# Patient Record
Sex: Male | Born: 1950 | Race: White | Hispanic: No | Marital: Single | Smoking: Current some day smoker
Health system: Southern US, Community
[De-identification: ages and names within clinical notes are randomized; demographics above are authoritative.]

## PROBLEM LIST (undated history)

## (undated) DIAGNOSIS — I10 Essential (primary) hypertension: Secondary | ICD-10-CM

## (undated) DIAGNOSIS — Z923 Personal history of irradiation: Secondary | ICD-10-CM

## (undated) DIAGNOSIS — R0600 Dyspnea, unspecified: Secondary | ICD-10-CM

## (undated) DIAGNOSIS — K219 Gastro-esophageal reflux disease without esophagitis: Secondary | ICD-10-CM

## (undated) DIAGNOSIS — C801 Malignant (primary) neoplasm, unspecified: Secondary | ICD-10-CM

## (undated) DIAGNOSIS — I219 Acute myocardial infarction, unspecified: Secondary | ICD-10-CM

## (undated) DIAGNOSIS — T148XXA Other injury of unspecified body region, initial encounter: Secondary | ICD-10-CM

## (undated) DIAGNOSIS — D496 Neoplasm of unspecified behavior of brain: Secondary | ICD-10-CM

## (undated) DIAGNOSIS — C3490 Malignant neoplasm of unspecified part of unspecified bronchus or lung: Secondary | ICD-10-CM

## (undated) DIAGNOSIS — C189 Malignant neoplasm of colon, unspecified: Secondary | ICD-10-CM

## (undated) HISTORY — PX: PORTA CATH INSERTION: CATH118285

## (undated) HISTORY — PX: OTHER SURGICAL HISTORY: SHX169

## (undated) HISTORY — PX: APPENDECTOMY: SHX54

---

## 2002-03-18 HISTORY — PX: FACIAL RECONSTRUCTION SURGERY: SHX631

## 2011-03-19 DIAGNOSIS — C189 Malignant neoplasm of colon, unspecified: Secondary | ICD-10-CM

## 2011-03-19 HISTORY — DX: Malignant neoplasm of colon, unspecified: C18.9

## 2014-06-29 DIAGNOSIS — D124 Benign neoplasm of descending colon: Secondary | ICD-10-CM | POA: Diagnosis not present

## 2015-03-19 DIAGNOSIS — C3490 Malignant neoplasm of unspecified part of unspecified bronchus or lung: Secondary | ICD-10-CM

## 2015-03-19 HISTORY — DX: Malignant neoplasm of unspecified part of unspecified bronchus or lung: C34.90

## 2015-11-10 DIAGNOSIS — T148 Other injury of unspecified body region: Secondary | ICD-10-CM | POA: Diagnosis not present

## 2015-11-10 DIAGNOSIS — I10 Essential (primary) hypertension: Secondary | ICD-10-CM | POA: Diagnosis not present

## 2015-11-10 DIAGNOSIS — S60949A Unspecified superficial injury of unspecified finger, initial encounter: Secondary | ICD-10-CM | POA: Diagnosis not present

## 2017-05-16 DIAGNOSIS — F17218 Nicotine dependence, cigarettes, with other nicotine-induced disorders: Secondary | ICD-10-CM

## 2017-05-16 DIAGNOSIS — Z8579 Personal history of other malignant neoplasms of lymphoid, hematopoietic and related tissues: Secondary | ICD-10-CM | POA: Diagnosis not present

## 2017-05-16 DIAGNOSIS — Z85038 Personal history of other malignant neoplasm of large intestine: Secondary | ICD-10-CM

## 2017-05-16 DIAGNOSIS — C7A1 Malignant poorly differentiated neuroendocrine tumors: Secondary | ICD-10-CM | POA: Diagnosis not present

## 2017-06-05 DIAGNOSIS — C7A1 Malignant poorly differentiated neuroendocrine tumors: Secondary | ICD-10-CM

## 2017-06-27 ENCOUNTER — Encounter: Payer: Self-pay | Admitting: Gastroenterology

## 2017-07-01 DIAGNOSIS — C7A1 Malignant poorly differentiated neuroendocrine tumors: Secondary | ICD-10-CM | POA: Diagnosis not present

## 2017-07-15 DIAGNOSIS — C7A1 Malignant poorly differentiated neuroendocrine tumors: Secondary | ICD-10-CM | POA: Diagnosis not present

## 2017-07-15 DIAGNOSIS — K208 Other esophagitis: Secondary | ICD-10-CM | POA: Diagnosis not present

## 2017-07-29 DIAGNOSIS — C7A1 Malignant poorly differentiated neuroendocrine tumors: Secondary | ICD-10-CM | POA: Diagnosis not present

## 2017-09-05 DIAGNOSIS — R590 Localized enlarged lymph nodes: Secondary | ICD-10-CM

## 2017-09-05 DIAGNOSIS — C3431 Malignant neoplasm of lower lobe, right bronchus or lung: Secondary | ICD-10-CM | POA: Diagnosis not present

## 2017-09-05 DIAGNOSIS — Z9221 Personal history of antineoplastic chemotherapy: Secondary | ICD-10-CM

## 2017-09-05 DIAGNOSIS — Z923 Personal history of irradiation: Secondary | ICD-10-CM

## 2017-10-08 DIAGNOSIS — C3431 Malignant neoplasm of lower lobe, right bronchus or lung: Secondary | ICD-10-CM

## 2017-11-05 DIAGNOSIS — C3431 Malignant neoplasm of lower lobe, right bronchus or lung: Secondary | ICD-10-CM

## 2017-12-31 DIAGNOSIS — C3431 Malignant neoplasm of lower lobe, right bronchus or lung: Secondary | ICD-10-CM

## 2018-01-28 DIAGNOSIS — I951 Orthostatic hypotension: Secondary | ICD-10-CM | POA: Diagnosis not present

## 2018-01-28 DIAGNOSIS — C3431 Malignant neoplasm of lower lobe, right bronchus or lung: Secondary | ICD-10-CM | POA: Diagnosis not present

## 2018-02-10 DIAGNOSIS — C7931 Secondary malignant neoplasm of brain: Secondary | ICD-10-CM

## 2018-02-10 DIAGNOSIS — C3431 Malignant neoplasm of lower lobe, right bronchus or lung: Secondary | ICD-10-CM

## 2018-02-10 DIAGNOSIS — Z7952 Long term (current) use of systemic steroids: Secondary | ICD-10-CM | POA: Diagnosis not present

## 2018-02-11 ENCOUNTER — Other Ambulatory Visit: Payer: Self-pay | Admitting: Radiation Therapy

## 2018-02-11 DIAGNOSIS — C7931 Secondary malignant neoplasm of brain: Secondary | ICD-10-CM | POA: Diagnosis not present

## 2018-02-11 DIAGNOSIS — C3431 Malignant neoplasm of lower lobe, right bronchus or lung: Secondary | ICD-10-CM | POA: Diagnosis not present

## 2018-02-11 NOTE — Progress Notes (Signed)
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   Past or anticipated interventions, if any, per neurosurgery:  He denies seeing anybody from neurosurgery.   Past or anticipated interventions, if any, per medical oncology:  He is being treated Dr. Lewis in [place].   Dose of Decadron, if applicable: He tells me he is taking a small green pill. ? Decadron. He is not sure of the dose.   Recent neurologic symptoms, if any:   Seizures: No  Headaches: He reported headaches before brain scan. He has not had headaches since receiving a shot. ? Decadron.   Nausea: He had nausea at the time of his brain scan, but it has improved.   Dizziness/ataxia: Yes.   Difficulty with hand coordination: No  Focal numbness/weakness: No  Visual deficits/changes: No  Confusion/Memory deficits: No  Painful bone metastases at present, if any:  He denies.   SAFETY ISSUES:  Prior radiation? Yes, tonsil radiation, in [place] ? 2016, Lung radiation ?2018 in [place].   Pacemaker/ICD? No  Possible current pregnancy? No  Is the patient on methotrexate? No  Additional Complaints / other details:  No chart at this time. - notes to looked into by Clerical.   BP (!) 128/93 (BP Location: Left Arm)   Pulse 60   Temp 98 F (36.7 C) (Oral)   Ht 5' 11" (1.803 m)   Wt 155 lb (70.3 kg)   SpO2 99%   BMI 21.62 kg/m    Wt Readings from Last 3 Encounters:  02/16/18 155 lb (70.3 kg)   

## 2018-02-16 ENCOUNTER — Other Ambulatory Visit: Payer: Self-pay | Admitting: Neurosurgery

## 2018-02-16 ENCOUNTER — Ambulatory Visit
Admission: RE | Admit: 2018-02-16 | Discharge: 2018-02-16 | Disposition: A | Discharge status: Home / Self Care | Payer: Medicare Other | Source: Ambulatory Visit | Attending: Radiation Oncology | Admitting: Radiation Oncology

## 2018-02-16 ENCOUNTER — Other Ambulatory Visit: Payer: Self-pay | Admitting: Radiation Therapy

## 2018-02-16 ENCOUNTER — Encounter: Payer: Self-pay | Admitting: Radiation Oncology

## 2018-02-16 ENCOUNTER — Other Ambulatory Visit: Payer: Self-pay

## 2018-02-16 VITALS — BP 128/93 | HR 60 | Temp 98.0°F | Ht 71.0 in | Wt 155.0 lb

## 2018-02-16 DIAGNOSIS — C7931 Secondary malignant neoplasm of brain: Secondary | ICD-10-CM

## 2018-02-16 DIAGNOSIS — Z7982 Long term (current) use of aspirin: Secondary | ICD-10-CM | POA: Insufficient documentation

## 2018-02-16 DIAGNOSIS — Z79899 Other long term (current) drug therapy: Secondary | ICD-10-CM | POA: Insufficient documentation

## 2018-02-16 DIAGNOSIS — F17210 Nicotine dependence, cigarettes, uncomplicated: Secondary | ICD-10-CM | POA: Diagnosis not present

## 2018-02-16 DIAGNOSIS — I10 Essential (primary) hypertension: Secondary | ICD-10-CM | POA: Insufficient documentation

## 2018-02-16 DIAGNOSIS — C7949 Secondary malignant neoplasm of other parts of nervous system: Principal | ICD-10-CM

## 2018-02-16 HISTORY — DX: Acute myocardial infarction, unspecified: I21.9

## 2018-02-16 HISTORY — DX: Malignant (primary) neoplasm, unspecified: C80.1

## 2018-02-16 HISTORY — DX: Gastro-esophageal reflux disease without esophagitis: K21.9

## 2018-02-16 HISTORY — DX: Essential (primary) hypertension: I10

## 2018-02-16 HISTORY — DX: Malignant neoplasm of unspecified part of unspecified bronchus or lung: C34.90

## 2018-02-16 HISTORY — DX: Malignant neoplasm of colon, unspecified: C18.9

## 2018-02-16 NOTE — Progress Notes (Addendum)
Has armband been applied?  Yes  Does patient have an allergy to IV contrast dye?: No   Has patient ever received premedication for IV contrast dye?: N/A  Does patient take metformin?: No  If patient does take metformin when was the last dose: N/A  Date of lab work: 02/04/18 at [facility] BUN: 16 CR: 0.50 EGFR: >60  IV site: R PAC accessed without difficulty. Blood return noted.   Alexander Monroe is scheduled for a 3 T MRI today at 2 pm at [facility]. He has requested to stay accessed until the MRI today to avoid a second stick today. I have called and spoken to [facility] and made them aware that he will come already accessed and they will need to deaccess him. The person I spoke to voiced her understanding. I also informed [name] that he will remain accessed until his MRI. He voiced his appreciation and understands to make sure they remove the PAC needle after his MRI scan.   Has IV site been added to flowsheet?  Yes  BP 122/89   Pulse (!) 55   Temp 97.6 F (36.4 C) (Oral)   Wt 155 lb 6.4 oz (70.5 kg)   SpO2 100%   BMI 21.67 kg/m

## 2018-02-16 NOTE — Progress Notes (Signed)
error 

## 2018-02-17 ENCOUNTER — Ambulatory Visit (HOSPITAL_COMMUNITY): Payer: Medicare Other

## 2018-02-17 ENCOUNTER — Ambulatory Visit
Admission: RE | Admit: 2018-02-17 | Discharge: 2018-02-17 | Disposition: A | Discharge status: Home / Self Care | Payer: No Typology Code available for payment source | Source: Ambulatory Visit | Attending: Radiation Oncology | Admitting: Radiation Oncology

## 2018-02-17 ENCOUNTER — Ambulatory Visit
Admission: RE | Admit: 2018-02-17 | Payer: No Typology Code available for payment source | Source: Ambulatory Visit | Admitting: Radiation Oncology

## 2018-02-17 DIAGNOSIS — C7931 Secondary malignant neoplasm of brain: Secondary | ICD-10-CM | POA: Insufficient documentation

## 2018-02-17 DIAGNOSIS — Z510 Encounter for antineoplastic radiation therapy: Secondary | ICD-10-CM | POA: Insufficient documentation

## 2018-02-17 DIAGNOSIS — C3432 Malignant neoplasm of lower lobe, left bronchus or lung: Secondary | ICD-10-CM | POA: Insufficient documentation
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
## 2018-02-18 ENCOUNTER — Ambulatory Visit
Admission: RE | Admit: 2018-02-18 | Discharge: 2018-02-18 | Disposition: A | Discharge status: Home / Self Care | Payer: Self-pay | Source: Ambulatory Visit | Attending: Radiation Oncology | Admitting: Radiation Oncology

## 2018-02-18 ENCOUNTER — Other Ambulatory Visit: Payer: Self-pay | Admitting: Radiation Oncology

## 2018-02-18 ENCOUNTER — Encounter: Payer: Self-pay | Admitting: Radiation Oncology

## 2018-02-18 ENCOUNTER — Ambulatory Visit
Admission: RE | Admit: 2018-02-18 | Discharge: 2018-02-18 | Disposition: A | Discharge status: Home / Self Care | Payer: No Typology Code available for payment source | Source: Ambulatory Visit | Attending: Radiation Oncology | Admitting: Radiation Oncology

## 2018-02-18 ENCOUNTER — Ambulatory Visit (HOSPITAL_COMMUNITY)
Admission: RE | Admit: 2018-02-18 | Discharge: 2018-02-18 | Disposition: A | Discharge status: Home / Self Care | Payer: No Typology Code available for payment source | Source: Ambulatory Visit | Attending: Radiation Oncology | Admitting: Radiation Oncology

## 2018-02-18 VITALS — BP 122/89 | HR 55 | Temp 97.6°F | Wt 155.4 lb

## 2018-02-18 DIAGNOSIS — Z510 Encounter for antineoplastic radiation therapy: Secondary | ICD-10-CM | POA: Diagnosis not present

## 2018-02-18 DIAGNOSIS — C7949 Secondary malignant neoplasm of other parts of nervous system: Secondary | ICD-10-CM | POA: Insufficient documentation

## 2018-02-18 DIAGNOSIS — C3432 Malignant neoplasm of lower lobe, left bronchus or lung: Secondary | ICD-10-CM | POA: Diagnosis not present

## 2018-02-18 DIAGNOSIS — C7931 Secondary malignant neoplasm of brain: Secondary | ICD-10-CM

## 2018-02-18 LAB — CREATININE, SERUM
Creatinine, Ser: 0.64 mg/dL (ref 0.61–1.24)
GFR calc Af Amer: >60 mL/min (ref >60)
GFR calc non Af Amer: >60 mL/min (ref >60)

## 2018-02-18 MED ORDER — HEPARIN SOD (PORK) LOCK FLUSH 100 UNIT/ML IV SOLN
500.0000 [IU] | INTRAVENOUS | Status: AC | PRN
  Administered 2018-02-18: 500 [IU]

## 2018-02-18 MED ORDER — GADOBUTROL 1 MMOL/ML IV SOLN
7.0000 mL | Freq: Once | INTRAVENOUS | Status: AC | PRN
  Administered 2018-02-18: 7 mL via INTRAVENOUS

## 2018-02-18 MED ORDER — SODIUM CHLORIDE 0.9% FLUSH
10.0000 mL | Freq: Once | INTRAVENOUS | Status: AC
  Administered 2018-02-18: 10 mL via INTRAVENOUS

## 2018-02-18 NOTE — Progress Notes (Signed)
error 

## 2018-02-18 NOTE — H&P (View-Only) (Signed)
  Radiation Oncology         [phone] ________________________________  Name: Alexander Monroe MRN: [id number]  Date: 02/18/2018  DOB: Dec 05, 1950  Outpatient  SIMULATION AND TREATMENT PLANNING NOTE; SPECIAL TREATMENT PROCEDURE NOTE:   DIAGNOSIS:    ICD-10-CM   1. Brain metastasis ([place]) C79.31     NARRATIVE:  The patient was brought to the [place].  Identity was confirmed.  All relevant records and images related to the planned course of therapy were reviewed.  The patient freely provided informed written consent to proceed with treatment after reviewing the details related to the planned course of therapy. The consent form was witnessed and verified by the simulation staff. Intravenous access was established for contrast administration. Then, the patient was set-up in a stable reproducible supine position for radiation therapy.  A relocatable thermoplastic stereotactic head frame was fabricated for precise immobilization.  CT images were obtained.  Surface markings were placed.  The CT images were loaded into the planning software and fused with the patient's targeting MRI scan.  Then the target and avoidance structures were contoured.  Treatment planning then occurred.  The radiation prescription was entered and confirmed.  I have requested 3D planning  I have requested a DVH of the following structures: Brain stem, brain, left eye, right ete, lenses, optic chiasm, target volumes, uninvolved brain, and normal tissue.    PLAN:  The patient will receive 14 Gy in 1 fraction to cerebellar metastasis preoperatively. Stereotactic radiosurgery will be used.  SPECIAL TREATMENT PROCEDURE NOTE:   This constitutes a special treatment procedure due to the ablative dose that will be delivered and the technical nature of treatment.  This highly technical modality of treatment ensures that the ablative dose is centered on the patient's tumor while sparing normal tissues from excessive  dose and risk of detrimental effects.   -----------------------------------  Eppie Gibson, MD

## 2018-02-18 NOTE — Progress Notes (Addendum)
  Radiation Oncology         [phone]) [phone] ________________________________  Name: Alexander Monroe MRN: [id number]  Date: 02/18/2018  DOB: 04-18-1950  Outpatient  SIMULATION AND TREATMENT PLANNING NOTE; SPECIAL TREATMENT PROCEDURE NOTE:   DIAGNOSIS:    ICD-10-CM   1. Brain metastasis ([place]) C79.31     NARRATIVE:  The patient was brought to the [place].  Identity was confirmed.  All relevant records and images related to the planned course of therapy were reviewed.  The patient freely provided informed written consent to proceed with treatment after reviewing the details related to the planned course of therapy. The consent form was witnessed and verified by the simulation staff. Intravenous access was established for contrast administration. Then, the patient was set-up in a stable reproducible supine position for radiation therapy.  A relocatable thermoplastic stereotactic head frame was fabricated for precise immobilization.  CT images were obtained.  Surface markings were placed.  The CT images were loaded into the planning software and fused with the patient's targeting MRI scan.  Then the target and avoidance structures were contoured.  Treatment planning then occurred.  The radiation prescription was entered and confirmed.  I have requested 3D planning  I have requested a DVH of the following structures: Brain stem, brain, left eye, right ete, lenses, optic chiasm, target volumes, uninvolved brain, and normal tissue.    PLAN:  The patient will receive 14 Gy in 1 fraction to cerebellar metastasis preoperatively. Stereotactic radiosurgery will be used.  SPECIAL TREATMENT PROCEDURE NOTE:   This constitutes a special treatment procedure due to the ablative dose that will be delivered and the technical nature of treatment.  This highly technical modality of treatment ensures that the ablative dose is centered on the patient's tumor while sparing normal tissues from excessive  dose and risk of detrimental effects.   -----------------------------------  Eppie Gibson, MD

## 2018-02-19 NOTE — Pre-Procedure Instructions (Signed)
TAYE CATO  02/19/2018      [place], [place] [address] Phone: [phone] Fax: [phone]    Your procedure is scheduled on Dec. 10  Report to [facility] Admitting at 5:30  A.M.  Call this number if you have problems the morning of surgery:  [phone]   Remember:   Do not eat or drink after midnight.      Take these medicines the morning of surgery with A SIP OF WATER :              Carvedilol (coreg)             Dexamethasone (decadron)             Omeprazole (prilosec)             Ondansetron (zofran) if needed    Do not wear jewelry.  Do not wear lotions, powders, or perfumes, or deodorant.  Do not shave 48 hours prior to surgery.  Men may shave face and neck.  Do not bring valuables to the hospital.  [facility] is not responsible for any belongings or valuables.  Contacts, dentures or bridgework may not be worn into surgery.  Leave your suitcase in the car.  After surgery it may be brought to your room.  For patients admitted to the hospital, discharge time will be determined by your treatment team.  Patients discharged the day of surgery will not be allowed to drive home.    Special instructions:  [place]- Preparing For Surgery  Before surgery, you can play an important role. Because skin is not sterile, your skin needs to be as free of germs as possible. You can reduce the number of germs on your skin by washing with CHG (chlorahexidine gluconate) Soap before surgery.  CHG is an antiseptic cleaner which kills germs and bonds with the skin to continue killing germs even after washing.    Oral Hygiene is also important to reduce your risk of infection.  Remember - BRUSH YOUR TEETH THE MORNING OF SURGERY WITH YOUR REGULAR TOOTHPASTE  Please do not use if you have an allergy to CHG or antibacterial soaps. If your skin becomes reddened/irritated stop using  the CHG.  Do not shave (including legs and underarms) for at least 48 hours prior to first CHG shower. It is OK to shave your face.  Please follow these instructions carefully.   1. Shower the NIGHT BEFORE SURGERY and the MORNING OF SURGERY with CHG.   2. If you chose to wash your hair, wash your hair first as usual with your normal shampoo.  3. After you shampoo, rinse your hair and body thoroughly to remove the shampoo.  4. Use CHG as you would any other liquid soap. You can apply CHG directly to the skin and wash gently with a scrungie or a clean washcloth.   5. Apply the CHG Soap to your body ONLY FROM THE NECK DOWN.  Do not use on open wounds or open sores. Avoid contact with your eyes, ears, mouth and genitals (private parts). Wash Face and genitals (private parts)  with your normal soap.  6. Wash thoroughly, paying special attention to the area where your surgery will be performed.  7. Thoroughly rinse your body with warm water from the neck down.  8. DO NOT shower/wash with your normal soap after using and rinsing  off the CHG Soap.  9. Pat yourself dry with a CLEAN TOWEL.  10. Wear CLEAN PAJAMAS to bed the night before surgery, wear comfortable clothes the morning of surgery  11. Place CLEAN SHEETS on your bed the night of your first shower and DO NOT SLEEP WITH PETS.    Day of Surgery:  Do not apply any deodorants/lotions.  Please wear clean clothes to the hospital/surgery center.   Remember to brush your teeth WITH YOUR REGULAR TOOTHPASTE.    Please read over the following fact sheets that you were given. Coughing and Deep Breathing and Surgical Site Infection Prevention

## 2018-02-20 ENCOUNTER — Encounter (HOSPITAL_COMMUNITY): Payer: Self-pay

## 2018-02-20 ENCOUNTER — Encounter (HOSPITAL_COMMUNITY)
Admission: RE | Admit: 2018-02-20 | Discharge: 2018-02-20 | Disposition: A | Discharge status: Home / Self Care | Payer: Medicare Other | Source: Ambulatory Visit | Attending: Neurosurgery | Admitting: Neurosurgery

## 2018-02-20 DIAGNOSIS — Z01818 Encounter for other preprocedural examination: Secondary | ICD-10-CM | POA: Insufficient documentation

## 2018-02-20 DIAGNOSIS — Z510 Encounter for antineoplastic radiation therapy: Secondary | ICD-10-CM | POA: Diagnosis not present

## 2018-02-20 DIAGNOSIS — R001 Bradycardia, unspecified: Secondary | ICD-10-CM | POA: Insufficient documentation

## 2018-02-20 HISTORY — DX: Other injury of unspecified body region, initial encounter: T14.8XXA

## 2018-02-20 HISTORY — DX: Dyspnea, unspecified: R06.00

## 2018-02-20 LAB — BASIC METABOLIC PANEL
Anion gap: 10 (ref 5–15)
BUN: 17 mg/dL (ref 8–23)
CALCIUM: 9.1 mg/dL (ref 8.9–10.3)
CO2: 23 mmol/L (ref 22–32)
Chloride: 106 mmol/L (ref 98–111)
Creatinine, Ser: 0.72 mg/dL (ref 0.61–1.24)
GFR calc Af Amer: >60 mL/min (ref >60)
GFR calc non Af Amer: >60 mL/min (ref >60)
Glucose, Bld: 110 mg/dL — ABNORMAL HIGH (ref 70–99)
Potassium: 4.1 mmol/L (ref 3.5–5.1)
Sodium: 139 mmol/L (ref 135–145)

## 2018-02-20 LAB — TYPE AND SCREEN
ABO/RH(D): A POS
Antibody Screen: NEGATIVE

## 2018-02-20 LAB — CBC
HCT: 44.7 % (ref 39.0–52.0)
Hemoglobin: 13.5 g/dL (ref 13.0–17.0)
MCH: 27.2 pg (ref 26.0–34.0)
MCHC: 30.2 g/dL (ref 30.0–36.0)
MCV: 90.1 fL (ref 80.0–100.0)
PLATELETS: 258 10*3/uL (ref 150–400)
RBC: 4.96 MIL/uL (ref 4.22–5.81)
RDW: 15.2 % (ref 11.5–15.5)
WBC: 11.6 10*3/uL — ABNORMAL HIGH (ref 4.0–10.5)
nRBC: 0 % (ref 0.0–0.2)

## 2018-02-20 LAB — ABO/RH: ABO/RH(D): A POS

## 2018-02-20 NOTE — Progress Notes (Signed)
PCP: Sharmaine Base @ [facility]  No cardiologist [facility] for chemo tx.  Pt. Not instructed when to stop aspirin,pt. To call Dr. Melven Sartorius office for clairification  Pt. Stated he fell 02/12/18, went to ED in [place],stated he tore some ligaments in right shoulder and they gave him a sling to wear. Stated he notified Dr. Vertell Limber of this fall.

## 2018-02-23 ENCOUNTER — Ambulatory Visit
Admission: RE | Admit: 2018-02-23 | Discharge: 2018-02-23 | Disposition: A | Discharge status: Home / Self Care | Payer: No Typology Code available for payment source | Source: Ambulatory Visit | Attending: Radiation Oncology | Admitting: Radiation Oncology

## 2018-02-23 DIAGNOSIS — Z510 Encounter for antineoplastic radiation therapy: Secondary | ICD-10-CM | POA: Diagnosis not present

## 2018-02-23 DIAGNOSIS — Z923 Personal history of irradiation: Secondary | ICD-10-CM

## 2018-02-23 HISTORY — DX: Personal history of irradiation: Z92.3

## 2018-02-23 NOTE — Op Note (Signed)
  Name: Alexander Monroe  MRN: [id number]  Date: 02/23/2018   DOB: Aug 13, 1950  Stereotactic Radiosurgery Operative Note  PRE-OPERATIVE DIAGNOSIS:  Multiple Brain Metastases  POST-OPERATIVE DIAGNOSIS:  Multiple Brain Metastases  PROCEDURE:  Stereotactic Radiosurgery  SURGEON:  Peggyann Shoals, MD  NARRATIVE: The patient underwent a radiation treatment planning session in the radiation oncology simulation suite under the care of the radiation oncology physician and physicist.  I participated closely in the radiation treatment planning afterwards. The patient underwent planning CT which was fused to 3T high resolution MRI with 1 mm axial slices.  These images were fused on the planning system.  We contoured the gross target volumes and subsequently expanded this to yield the Planning Target Volume. I actively participated in the planning process.  I helped to define and review the target contours and also the contours of the optic pathway, eyes, brainstem and selected nearby organs at risk.  All the dose constraints for critical structures were reviewed and compared to AAPM Task Group 101.  The prescription dose conformity was reviewed.  I approved the plan electronically.    Accordingly, Alexander Monroe was brought to the TrueBeam stereotactic radiation treatment linac and placed in the custom immobilization mask.  The patient was aligned according to the IR fiducial markers with BrainLab Exactrac, then orthogonal x-rays were used in ExacTrac with the 6DOF robotic table and the shifts were made to align the patient  Alexander Monroe received stereotactic radiosurgery uneventfully.    Lesions treated:  1   Complex lesions treated:  0 (>3.5 cm, <2mm of optic path, or within the brainstem)   The detailed description of the procedure is recorded in the radiation oncology procedure note.  I was present for the duration of the procedure.  DISPOSITION:  Following delivery, the patient was transported to  nursing in stable condition and monitored for possible acute effects to be discharged to home in stable condition with follow-up in one month.  Peggyann Shoals, MD 02/23/2018 8:32 AM

## 2018-02-23 NOTE — Anesthesia Preprocedure Evaluation (Addendum)
Anesthesia Evaluation  Patient identified by MRN, date of birth, ID band Patient awake    Reviewed: Allergy & Precautions, NPO status , Patient's Chart, lab work & pertinent test results  History of Anesthesia Complications Negative for: history of anesthetic complications  Airway Mallampati: III  TM Distance: >3 FB Neck ROM: Full    Dental  (+) Edentulous Upper, Edentulous Lower   Pulmonary shortness of breath, Current Smoker,    breath sounds clear to auscultation       Cardiovascular hypertension, Pt. on medications + Past MI   Rhythm:Regular     Neuro/Psych Brain tumor with numbness and weakness in legs per patient    GI/Hepatic Neg liver ROS, GERD  ,  Endo/Other  negative endocrine ROS  Renal/GU negative Renal ROS     Musculoskeletal   Abdominal   Peds  Hematology negative hematology ROS (+)   Anesthesia Other Findings   Reproductive/Obstetrics                            Anesthesia Physical Anesthesia Plan  ASA: III  Anesthesia Plan: General   Post-op Pain Management:    Induction: Intravenous  PONV Risk Score and Plan: 1 and Ondansetron and Dexamethasone  Airway Management Planned: Oral ETT  Additional Equipment: Arterial line  Intra-op Plan:   Post-operative Plan: Extubation in OR and Possible Post-op intubation/ventilation  Informed Consent: I have reviewed the patients History and Physical, chart, labs and discussed the procedure including the risks, benefits and alternatives for the proposed anesthesia with the patient or authorized representative who has indicated his/her understanding and acceptance.   Dental advisory given  Plan Discussed with: CRNA and Surgeon  Anesthesia Plan Comments: (Recent EBUS at [facility] 04/08/2017. Review of anesthesia record in care everywhere shows difficult airway. Anesthesia record as follows:  Endotracheal tube insertion site:  oral Blade: Miller Blade size: #2 ETT size: 8.0 mm View (Cormack Lehane grade): grade IIb - view of arytenoids or posterior of glottis only Placement verified by: chest auscultation/breath sounds equal bilaterally and capnometry/+EtCO2  Measured from: gums  Number of attempts at approach: 3 or more Ventilation between attempts: 2 hand mask Additional Comments DVL by Cruciani with mac 3 and miller 2, unable to visualize due to large tounge, DVL by Dr Lajuan Lines, DVL x 1 and successful )       Anesthesia Quick Evaluation

## 2018-02-23 NOTE — Addendum Note (Signed)
Encounter addended by: Eppie Gibson, MD on: 02/23/2018 8:03 AM  Actions taken: Sign clinical note

## 2018-02-24 ENCOUNTER — Encounter (HOSPITAL_COMMUNITY)
Admission: RE | Disposition: A | Discharge status: Home / Self Care | Payer: Self-pay | Source: Home / Self Care | Attending: Neurosurgery

## 2018-02-24 ENCOUNTER — Inpatient Hospital Stay (HOSPITAL_COMMUNITY)
Admission: RE | Admit: 2018-02-24 | Discharge: 2018-02-25 | DRG: 026 | Disposition: A | Discharge status: Home / Self Care | Payer: Medicare Other | Attending: Neurosurgery | Admitting: Neurosurgery

## 2018-02-24 ENCOUNTER — Encounter (HOSPITAL_COMMUNITY): Payer: Self-pay | Admitting: Certified Registered"

## 2018-02-24 ENCOUNTER — Inpatient Hospital Stay (HOSPITAL_COMMUNITY): Payer: Medicare Other

## 2018-02-24 ENCOUNTER — Inpatient Hospital Stay (HOSPITAL_COMMUNITY): Payer: Medicare Other | Admitting: Physician Assistant

## 2018-02-24 ENCOUNTER — Inpatient Hospital Stay (HOSPITAL_COMMUNITY): Payer: Medicare Other | Admitting: Certified Registered"

## 2018-02-24 ENCOUNTER — Other Ambulatory Visit: Payer: Self-pay

## 2018-02-24 DIAGNOSIS — C771 Secondary and unspecified malignant neoplasm of intrathoracic lymph nodes: Secondary | ICD-10-CM | POA: Diagnosis present

## 2018-02-24 DIAGNOSIS — Z993 Dependence on wheelchair: Secondary | ICD-10-CM

## 2018-02-24 DIAGNOSIS — K219 Gastro-esophageal reflux disease without esophagitis: Secondary | ICD-10-CM | POA: Diagnosis present

## 2018-02-24 DIAGNOSIS — C3431 Malignant neoplasm of lower lobe, right bronchus or lung: Secondary | ICD-10-CM | POA: Diagnosis present

## 2018-02-24 DIAGNOSIS — Z85038 Personal history of other malignant neoplasm of large intestine: Secondary | ICD-10-CM

## 2018-02-24 DIAGNOSIS — C3490 Malignant neoplasm of unspecified part of unspecified bronchus or lung: Secondary | ICD-10-CM | POA: Diagnosis present

## 2018-02-24 DIAGNOSIS — C7931 Secondary malignant neoplasm of brain: Secondary | ICD-10-CM | POA: Diagnosis present

## 2018-02-24 DIAGNOSIS — C7801 Secondary malignant neoplasm of right lung: Secondary | ICD-10-CM | POA: Diagnosis present

## 2018-02-24 DIAGNOSIS — Z79899 Other long term (current) drug therapy: Secondary | ICD-10-CM

## 2018-02-24 DIAGNOSIS — Z716 Tobacco abuse counseling: Secondary | ICD-10-CM

## 2018-02-24 DIAGNOSIS — Z7982 Long term (current) use of aspirin: Secondary | ICD-10-CM

## 2018-02-24 DIAGNOSIS — I10 Essential (primary) hypertension: Secondary | ICD-10-CM | POA: Diagnosis present

## 2018-02-24 DIAGNOSIS — F17210 Nicotine dependence, cigarettes, uncomplicated: Secondary | ICD-10-CM | POA: Diagnosis present

## 2018-02-24 DIAGNOSIS — I252 Old myocardial infarction: Secondary | ICD-10-CM | POA: Diagnosis not present

## 2018-02-24 DIAGNOSIS — Z85818 Personal history of malignant neoplasm of other sites of lip, oral cavity, and pharynx: Secondary | ICD-10-CM | POA: Diagnosis not present

## 2018-02-24 DIAGNOSIS — Z95828 Presence of other vascular implants and grafts: Secondary | ICD-10-CM

## 2018-02-24 HISTORY — PX: CRANIOTOMY: SHX93

## 2018-02-24 HISTORY — PX: APPLICATION OF CRANIAL NAVIGATION: SHX6578

## 2018-02-24 LAB — MRSA PCR SCREENING: MRSA by PCR: NEGATIVE

## 2018-02-24 SURGERY — CRANIOTOMY TUMOR EXCISION
Anesthesia: General | Site: Head

## 2018-02-24 MED ORDER — MIDAZOLAM HCL 2 MG/2ML IJ SOLN
INTRAMUSCULAR | Status: AC
  Filled 2018-02-24: qty 2

## 2018-02-24 MED ORDER — ROCURONIUM BROMIDE 50 MG/5ML IV SOSY
PREFILLED_SYRINGE | INTRAVENOUS | Status: AC
  Filled 2018-02-24: qty 5

## 2018-02-24 MED ORDER — ACETAMINOPHEN 650 MG RE SUPP: 650.0000 mg | RECTAL | Status: DC | PRN

## 2018-02-24 MED ORDER — ONDANSETRON HCL 4 MG PO TABS: 4.0000 mg | ORAL_TABLET | ORAL | Status: DC | PRN

## 2018-02-24 MED ORDER — 0.9 % SODIUM CHLORIDE (POUR BTL) OPTIME
TOPICAL | Status: DC | PRN
  Administered 2018-02-24 (×2): 1000 mL

## 2018-02-24 MED ORDER — SODIUM CHLORIDE 0.9% FLUSH
10.0000 mL | Freq: Two times a day (BID) | INTRAVENOUS | Status: DC
  Administered 2018-02-25 (×2): 10 mL

## 2018-02-24 MED ORDER — POTASSIUM CHLORIDE IN NACL 20-0.9 MEQ/L-% IV SOLN
INTRAVENOUS | Status: DC
  Administered 2018-02-24 – 2018-02-25 (×2): via INTRAVENOUS
  Filled 2018-02-24 (×2): qty 1000

## 2018-02-24 MED ORDER — FLEET ENEMA 7-19 GM/118ML RE ENEM: 1.0000 | ENEMA | Freq: Once | RECTAL | Status: DC | PRN

## 2018-02-24 MED ORDER — LABETALOL HCL 5 MG/ML IV SOLN: 10.0000 mg | INTRAVENOUS | Status: DC | PRN

## 2018-02-24 MED ORDER — ONDANSETRON HCL 4 MG/2ML IJ SOLN
INTRAMUSCULAR | Status: DC | PRN
  Administered 2018-02-24: 4 mg via INTRAVENOUS

## 2018-02-24 MED ORDER — DEXAMETHASONE SODIUM PHOSPHATE 10 MG/ML IJ SOLN
INTRAMUSCULAR | Status: AC
  Filled 2018-02-24: qty 1

## 2018-02-24 MED ORDER — ATORVASTATIN CALCIUM 40 MG PO TABS
40.0000 mg | ORAL_TABLET | Freq: Every day | ORAL | Status: DC
  Administered 2018-02-24: 40 mg via ORAL
  Filled 2018-02-24: qty 1

## 2018-02-24 MED ORDER — SUGAMMADEX SODIUM 200 MG/2ML IV SOLN
INTRAVENOUS | Status: DC | PRN
  Administered 2018-02-24: 200 mg via INTRAVENOUS

## 2018-02-24 MED ORDER — LIDOCAINE-EPINEPHRINE 1 %-1:100000 IJ SOLN
INTRAMUSCULAR | Status: AC
  Filled 2018-02-24: qty 1

## 2018-02-24 MED ORDER — LACTATED RINGERS IV SOLN
INTRAVENOUS | Status: DC | PRN
  Administered 2018-02-24: 07:00:00 via INTRAVENOUS

## 2018-02-24 MED ORDER — FENTANYL CITRATE (PF) 250 MCG/5ML IJ SOLN
INTRAMUSCULAR | Status: AC
  Filled 2018-02-24: qty 5

## 2018-02-24 MED ORDER — ACETAMINOPHEN 325 MG PO TABS: 650.0000 mg | ORAL_TABLET | ORAL | Status: DC | PRN

## 2018-02-24 MED ORDER — CHLORHEXIDINE GLUCONATE CLOTH 2 % EX PADS: 6.0000 | MEDICATED_PAD | Freq: Once | CUTANEOUS | Status: DC

## 2018-02-24 MED ORDER — POLYETHYLENE GLYCOL 3350 17 G PO PACK: 17.0000 g | PACK | Freq: Every day | ORAL | Status: DC | PRN

## 2018-02-24 MED ORDER — CEFAZOLIN SODIUM-DEXTROSE 2-4 GM/100ML-% IV SOLN
2.0000 g | INTRAVENOUS | Status: AC
  Administered 2018-02-24: 2 g via INTRAVENOUS
  Filled 2018-02-24: qty 100

## 2018-02-24 MED ORDER — SODIUM CHLORIDE 0.9 % IV SOLN
INTRAVENOUS | Status: DC | PRN
  Administered 2018-02-24: 60 ug/min via INTRAVENOUS

## 2018-02-24 MED ORDER — DOCUSATE SODIUM 100 MG PO CAPS
100.0000 mg | ORAL_CAPSULE | Freq: Two times a day (BID) | ORAL | Status: DC
  Administered 2018-02-24: 100 mg via ORAL
  Filled 2018-02-24 (×2): qty 1

## 2018-02-24 MED ORDER — LIDOCAINE 2% (20 MG/ML) 5 ML SYRINGE
INTRAMUSCULAR | Status: DC | PRN
  Administered 2018-02-24: 40 mg via INTRAVENOUS

## 2018-02-24 MED ORDER — PROMETHAZINE HCL 25 MG PO TABS: 12.5000 mg | ORAL_TABLET | ORAL | Status: DC | PRN

## 2018-02-24 MED ORDER — DEXAMETHASONE SODIUM PHOSPHATE 4 MG/ML IJ SOLN: 4.0000 mg | Freq: Four times a day (QID) | INTRAMUSCULAR | Status: DC

## 2018-02-24 MED ORDER — BACITRACIN ZINC 500 UNIT/GM EX OINT
TOPICAL_OINTMENT | CUTANEOUS | Status: AC
  Filled 2018-02-24: qty 28.35

## 2018-02-24 MED ORDER — ONDANSETRON HCL 4 MG/2ML IJ SOLN
4.0000 mg | INTRAMUSCULAR | Status: DC | PRN
  Administered 2018-02-24: 4 mg via INTRAVENOUS
  Filled 2018-02-24: qty 2

## 2018-02-24 MED ORDER — PROPOFOL 10 MG/ML IV BOLUS
INTRAVENOUS | Status: AC
  Filled 2018-02-24: qty 20

## 2018-02-24 MED ORDER — PROPOFOL 10 MG/ML IV BOLUS
INTRAVENOUS | Status: DC | PRN
  Administered 2018-02-24: 50 mg via INTRAVENOUS
  Administered 2018-02-24: 20 mg via INTRAVENOUS
  Administered 2018-02-24 (×2): 10 mg via INTRAVENOUS
  Administered 2018-02-24: 50 mg via INTRAVENOUS

## 2018-02-24 MED ORDER — THROMBIN 5000 UNITS EX SOLR
CUTANEOUS | Status: AC
  Filled 2018-02-24: qty 5000

## 2018-02-24 MED ORDER — ROCURONIUM BROMIDE 50 MG/5ML IV SOSY
PREFILLED_SYRINGE | INTRAVENOUS | Status: DC | PRN
  Administered 2018-02-24: 10 mg via INTRAVENOUS
  Administered 2018-02-24: 20 mg via INTRAVENOUS
  Administered 2018-02-24: 50 mg via INTRAVENOUS
  Administered 2018-02-24: 30 mg via INTRAVENOUS
  Administered 2018-02-24: 50 mg via INTRAVENOUS

## 2018-02-24 MED ORDER — BUPIVACAINE HCL (PF) 0.5 % IJ SOLN
INTRAMUSCULAR | Status: AC
  Filled 2018-02-24: qty 30

## 2018-02-24 MED ORDER — LACTATED RINGERS IV SOLN
INTRAVENOUS | Status: DC | PRN
  Administered 2018-02-24 (×2): via INTRAVENOUS

## 2018-02-24 MED ORDER — GLYCOPYRROLATE PF 0.2 MG/ML IJ SOSY
PREFILLED_SYRINGE | INTRAMUSCULAR | Status: DC | PRN
  Administered 2018-02-24: .1 mg via INTRAVENOUS

## 2018-02-24 MED ORDER — DEXAMETHASONE SODIUM PHOSPHATE 4 MG/ML IJ SOLN: 4.0000 mg | Freq: Three times a day (TID) | INTRAMUSCULAR | Status: DC

## 2018-02-24 MED ORDER — BISACODYL 10 MG RE SUPP: 10.0000 mg | Freq: Every day | RECTAL | Status: DC | PRN

## 2018-02-24 MED ORDER — BUPIVACAINE HCL (PF) 0.5 % IJ SOLN
INTRAMUSCULAR | Status: DC | PRN
  Administered 2018-02-24: 5 mL

## 2018-02-24 MED ORDER — LIDOCAINE-EPINEPHRINE 1 %-1:100000 IJ SOLN
INTRAMUSCULAR | Status: DC | PRN
  Administered 2018-02-24: 5 mL

## 2018-02-24 MED ORDER — THROMBIN 5000 UNITS EX SOLR
OROMUCOSAL | Status: DC | PRN
  Administered 2018-02-24: 5 mL via TOPICAL

## 2018-02-24 MED ORDER — CARVEDILOL 3.125 MG PO TABS
6.2500 mg | ORAL_TABLET | Freq: Two times a day (BID) | ORAL | Status: DC
  Administered 2018-02-24 – 2018-02-25 (×2): 6.25 mg via ORAL
  Filled 2018-02-24 (×2): qty 2

## 2018-02-24 MED ORDER — HYDROCODONE-ACETAMINOPHEN 5-325 MG PO TABS: 1.0000 | ORAL_TABLET | ORAL | Status: DC | PRN

## 2018-02-24 MED ORDER — CEFAZOLIN SODIUM-DEXTROSE 2-4 GM/100ML-% IV SOLN
2.0000 g | Freq: Three times a day (TID) | INTRAVENOUS | Status: AC
  Administered 2018-02-24 – 2018-02-25 (×2): 2 g via INTRAVENOUS
  Filled 2018-02-24 (×2): qty 100

## 2018-02-24 MED ORDER — DEXAMETHASONE 4 MG PO TABS: 4.0000 mg | ORAL_TABLET | Freq: Three times a day (TID) | ORAL | Status: DC

## 2018-02-24 MED ORDER — MICROFIBRILLAR COLL HEMOSTAT EX PADS
MEDICATED_PAD | CUTANEOUS | Status: DC | PRN
  Administered 2018-02-24: 1 via TOPICAL

## 2018-02-24 MED ORDER — DEXAMETHASONE SODIUM PHOSPHATE 10 MG/ML IJ SOLN
6.0000 mg | Freq: Four times a day (QID) | INTRAMUSCULAR | Status: AC
  Administered 2018-02-24 – 2018-02-25 (×4): 6 mg via INTRAVENOUS
  Filled 2018-02-24 (×4): qty 1

## 2018-02-24 MED ORDER — ONDANSETRON HCL 4 MG/2ML IJ SOLN
INTRAMUSCULAR | Status: AC
  Filled 2018-02-24: qty 2

## 2018-02-24 MED ORDER — FENTANYL CITRATE (PF) 100 MCG/2ML IJ SOLN
INTRAMUSCULAR | Status: DC | PRN
  Administered 2018-02-24 (×2): 50 ug via INTRAVENOUS
  Administered 2018-02-24: 100 ug via INTRAVENOUS
  Administered 2018-02-24: 50 ug via INTRAVENOUS

## 2018-02-24 MED ORDER — PANTOPRAZOLE SODIUM 40 MG IV SOLR
40.0000 mg | Freq: Every day | INTRAVENOUS | Status: DC
  Administered 2018-02-24: 40 mg via INTRAVENOUS
  Filled 2018-02-24: qty 40

## 2018-02-24 MED ORDER — PANTOPRAZOLE SODIUM 40 MG PO TBEC: 80.0000 mg | DELAYED_RELEASE_TABLET | Freq: Every day | ORAL | Status: DC

## 2018-02-24 MED ORDER — LIDOCAINE 2% (20 MG/ML) 5 ML SYRINGE
INTRAMUSCULAR | Status: AC
  Filled 2018-02-24: qty 5

## 2018-02-24 MED ORDER — BACITRACIN ZINC 500 UNIT/GM EX OINT
TOPICAL_OINTMENT | CUTANEOUS | Status: DC | PRN
  Administered 2018-02-24: 1 via TOPICAL

## 2018-02-24 MED ORDER — MORPHINE SULFATE (PF) 2 MG/ML IV SOLN: 1.0000 mg | INTRAVENOUS | Status: DC | PRN

## 2018-02-24 MED ORDER — THROMBIN 20000 UNITS EX SOLR
CUTANEOUS | Status: DC | PRN
  Administered 2018-02-24: 20 mL via TOPICAL

## 2018-02-24 MED ORDER — THROMBIN 20000 UNITS EX SOLR
CUTANEOUS | Status: AC
  Filled 2018-02-24: qty 20000

## 2018-02-24 MED ORDER — SODIUM CHLORIDE 0.9% FLUSH
10.0000 mL | INTRAVENOUS | Status: DC | PRN
  Administered 2018-02-25: 10 mL
  Filled 2018-02-24: qty 40

## 2018-02-24 MED ORDER — CHLORHEXIDINE GLUCONATE CLOTH 2 % EX PADS
6.0000 | MEDICATED_PAD | Freq: Every day | CUTANEOUS | Status: DC
  Administered 2018-02-25: 6 via TOPICAL

## 2018-02-24 MED ORDER — MICROFIBRILLAR COLL HEMOSTAT EX POWD
CUTANEOUS | Status: AC
  Filled 2018-02-24: qty 5

## 2018-02-24 MED ORDER — PHENYLEPHRINE 40 MCG/ML (10ML) SYRINGE FOR IV PUSH (FOR BLOOD PRESSURE SUPPORT)
PREFILLED_SYRINGE | INTRAVENOUS | Status: AC
  Filled 2018-02-24: qty 10

## 2018-02-24 MED ORDER — PHENYLEPHRINE 40 MCG/ML (10ML) SYRINGE FOR IV PUSH (FOR BLOOD PRESSURE SUPPORT)
PREFILLED_SYRINGE | INTRAVENOUS | Status: DC | PRN
  Administered 2018-02-24 (×7): 80 ug via INTRAVENOUS

## 2018-02-24 MED ORDER — DEXAMETHASONE SODIUM PHOSPHATE 4 MG/ML IJ SOLN
INTRAMUSCULAR | Status: DC | PRN
  Administered 2018-02-24: 10 mg via INTRAVENOUS

## 2018-02-24 MED ORDER — GLYCOPYRROLATE PF 0.2 MG/ML IJ SOSY
PREFILLED_SYRINGE | INTRAMUSCULAR | Status: AC
  Filled 2018-02-24: qty 1

## 2018-02-24 SURGICAL SUPPLY — 88 items
BIT DRILL WIRE PASS 1.3MM (BIT) IMPLANT
BLADE CLIPPER SURG (BLADE) ×4 IMPLANT
BNDG STRETCH 4X75 STRL LF (GAUZE/BANDAGES/DRESSINGS) IMPLANT
BUR ACORN 6.0 PRECISION (BURR) ×3 IMPLANT
BUR ACORN 6.0MM PRECISION (BURR) ×1
BUR PRECISION FLUTE 5.0 (BURR) ×4 IMPLANT
BUR SPIRAL ROUTER 2.3 (BUR) ×3 IMPLANT
BUR SPIRAL ROUTER 2.3MM (BUR) ×1
CANISTER SUCT 3000ML PPV (MISCELLANEOUS) ×8 IMPLANT
CARTRIDGE OIL MAESTRO DRILL (MISCELLANEOUS) ×2 IMPLANT
CLIP VESOCCLUDE MED 6/CT (CLIP) ×4 IMPLANT
CONT SPEC 4OZ CLIKSEAL STRL BL (MISCELLANEOUS) ×4 IMPLANT
COVER MAYO STAND STRL (DRAPES) IMPLANT
COVER WAND RF STERILE (DRAPES) ×4 IMPLANT
DECANTER SPIKE VIAL GLASS SM (MISCELLANEOUS) ×8 IMPLANT
DIFFUSER DRILL AIR PNEUMATIC (MISCELLANEOUS) ×4 IMPLANT
DRAPE MICROSCOPE LEICA (MISCELLANEOUS) ×4 IMPLANT
DRAPE NEUROLOGICAL W/INCISE (DRAPES) ×4 IMPLANT
DRAPE STERI IOBAN 125X83 (DRAPES) IMPLANT
DRAPE WARM FLUID 44X44 (DRAPE) ×4 IMPLANT
DRILL WIRE PASS 1.3MM (BIT)
DRSG OPSITE 4X5.5 SM (GAUZE/BANDAGES/DRESSINGS) ×4 IMPLANT
DRSG TELFA 3X8 NADH (GAUZE/BANDAGES/DRESSINGS) ×4 IMPLANT
DURAMATRIX ONLAY 2X2 (Neuro Prosthesis/Implant) ×4 IMPLANT
DURAPREP 6ML APPLICATOR 50/CS (WOUND CARE) ×8 IMPLANT
ELECT REM PT RETURN 9FT ADLT (ELECTROSURGICAL) ×4
ELECTRODE REM PT RTRN 9FT ADLT (ELECTROSURGICAL) ×2 IMPLANT
EVACUATOR 1/8 PVC DRAIN (DRAIN) IMPLANT
EVACUATOR SILICONE 100CC (DRAIN) IMPLANT
FORCEPS BIPOLAR SPETZLER 8 1.0 (NEUROSURGERY SUPPLIES) ×4 IMPLANT
GAUZE 4X4 16PLY RFD (DISPOSABLE) IMPLANT
GAUZE SPONGE 4X4 12PLY STRL (GAUZE/BANDAGES/DRESSINGS) IMPLANT
GLOVE BIO SURGEON STRL SZ7 (GLOVE) ×8 IMPLANT
GLOVE BIO SURGEON STRL SZ8 (GLOVE) ×8 IMPLANT
GLOVE BIOGEL PI IND STRL 7.5 (GLOVE) ×2 IMPLANT
GLOVE BIOGEL PI IND STRL 8 (GLOVE) ×8 IMPLANT
GLOVE BIOGEL PI IND STRL 8.5 (GLOVE) ×2 IMPLANT
GLOVE BIOGEL PI INDICATOR 7.5 (GLOVE) ×2
GLOVE BIOGEL PI INDICATOR 8 (GLOVE) ×8
GLOVE BIOGEL PI INDICATOR 8.5 (GLOVE) ×2
GLOVE ECLIPSE 7.0 STRL STRAW (GLOVE) ×4 IMPLANT
GLOVE ECLIPSE 7.5 STRL STRAW (GLOVE) ×12 IMPLANT
GLOVE ECLIPSE 8.0 STRL XLNG CF (GLOVE) ×4 IMPLANT
GLOVE EXAM NITRILE XL STR (GLOVE) IMPLANT
GOWN STRL REUS W/ TWL LRG LVL3 (GOWN DISPOSABLE) ×2 IMPLANT
GOWN STRL REUS W/ TWL XL LVL3 (GOWN DISPOSABLE) ×4 IMPLANT
GOWN STRL REUS W/TWL 2XL LVL3 (GOWN DISPOSABLE) ×8 IMPLANT
GOWN STRL REUS W/TWL LRG LVL3 (GOWN DISPOSABLE) ×2
GOWN STRL REUS W/TWL XL LVL3 (GOWN DISPOSABLE) ×4
HEMOSTAT SURGICEL 2X14 (HEMOSTASIS) ×4 IMPLANT
KIT BASIN OR (CUSTOM PROCEDURE TRAY) ×4 IMPLANT
KIT TURNOVER KIT B (KITS) ×4 IMPLANT
MARKER SKIN DUAL TIP RULER LAB (MISCELLANEOUS) ×4 IMPLANT
MARKER SPHERE PSV REFLC 13MM (MARKER) ×8 IMPLANT
NEEDLE HYPO 25X1 1.5 SAFETY (NEEDLE) ×4 IMPLANT
NS IRRIG 1000ML POUR BTL (IV SOLUTION) ×8 IMPLANT
OIL CARTRIDGE MAESTRO DRILL (MISCELLANEOUS) ×4
PACK CRANIOTOMY CUSTOM (CUSTOM PROCEDURE TRAY) ×4 IMPLANT
PAD ARMBOARD 7.5X6 YLW CONV (MISCELLANEOUS) ×20 IMPLANT
PATTIES SURGICAL .25X.25 (GAUZE/BANDAGES/DRESSINGS) IMPLANT
PATTIES SURGICAL .5 X.5 (GAUZE/BANDAGES/DRESSINGS) ×4 IMPLANT
PATTIES SURGICAL .5 X3 (DISPOSABLE) IMPLANT
PATTIES SURGICAL 1/4 X 3 (GAUZE/BANDAGES/DRESSINGS) IMPLANT
PATTIES SURGICAL 1X1 (DISPOSABLE) ×4 IMPLANT
PIN MAYFIELD SKULL DISP (PIN) ×4 IMPLANT
RUBBERBAND STERILE (MISCELLANEOUS) ×8 IMPLANT
SET CARTRIDGE AND TUBING (SET/KITS/TRAYS/PACK) IMPLANT
SPECIMEN JAR SMALL (MISCELLANEOUS) IMPLANT
SPONGE NEURO XRAY DETECT 1X3 (DISPOSABLE) IMPLANT
SPONGE SURGIFOAM ABS GEL 100 (HEMOSTASIS) ×4 IMPLANT
STAPLER SKIN PROX WIDE 3.9 (STAPLE) ×4 IMPLANT
SUT ETHILON 3 0 FSL (SUTURE) ×4 IMPLANT
SUT NURALON 4 0 TR CR/8 (SUTURE) ×4 IMPLANT
SUT SILK 2 0 PERMA HAND 18 BK (SUTURE) IMPLANT
SUT VIC AB 0 CT1 18XCR BRD8 (SUTURE) ×4 IMPLANT
SUT VIC AB 0 CT1 8-18 (SUTURE) ×4
SUT VIC AB 2-0 CP2 18 (SUTURE) ×8 IMPLANT
SYR CONTROL 10ML LL (SYRINGE) IMPLANT
TIP STANDARD 36KHZ (INSTRUMENTS)
TIP STD 36KHZ (INSTRUMENTS) IMPLANT
TOWEL GREEN STERILE (TOWEL DISPOSABLE) ×4 IMPLANT
TOWEL GREEN STERILE FF (TOWEL DISPOSABLE) ×4 IMPLANT
TRAY FOLEY MTR SLVR 16FR STAT (SET/KITS/TRAYS/PACK) ×4 IMPLANT
TUBE CONNECTING 12'X1/4 (SUCTIONS) ×1
TUBE CONNECTING 12X1/4 (SUCTIONS) ×3 IMPLANT
UNDERPAD 30X30 (UNDERPADS AND DIAPERS) IMPLANT
WATER STERILE IRR 1000ML POUR (IV SOLUTION) ×4 IMPLANT
WRENCH TORQUE 36KHZ (INSTRUMENTS) IMPLANT

## 2018-02-24 NOTE — H&P (Signed)
Patient ID:   [id number] Patient: Alexander Monroe  Date of Birth: September 07, 1950 Visit Type: Office Visit   Date: 02/18/2018 12:30 PM Provider: Marchia Meiers. Vertell Limber MD   This 67 year old male presents for Headaches and loss balance and vomiting.  HISTORY OF PRESENT ILLNESS:  1.  Headaches  2.  loss balance and vomiting  [name] is a 67 y.o. male who has a history of colon cancer in 2013 and a history of squamous cell carcinoma of the tonsil in 2015. In January 2019 the patient was diagnosed with lung cancer. His history of lung cancer dates back to August 2018 when his PCP at the [facility] recommended that he undergo CT scans for occult cancer detection, particularly as he had both colon cancer and tonsillar cancer. These CT scans incidentally showed a right lower lobe lung mass and suspicious hilar lymphadenopathy. These findings were further confirmed by a PET scan that was done in October 2018. The patient was sent to [facility] to undergo an endobronchial ultrasound in January 2019. A biopsy of 1 of the lymph nodes in his hilar region came back consistent with high-grade carcinoma with neuroendocrine features. He was referred to [facility] in March 2019 where a PET scan on 06/04/17 showed a 3.2 x 3.9 cm right lower lobe mass, compatible with primary bronchogenic neoplasm, with scattered satellite nodularity in the right lower lobe, indeterminate. There was also mediastinal and right hilar nodal metastases and a possible left hilar nodal metastasis. MRI of the head at that time showed no evidence for metastatic disease in the brain. He has received 11 cycles of maintenance durvalumab immunotherapy under the care and direction of Dr. Bobby Rumpf. He was doing well until recently he reported headaches, nausea, and balance issues. MRI of the brain on 02/10/17 demonstrated large metastatic deposits in the cerebellum with surrounding edema and mass-effect CT C/A/P on  02/11/18 showed decreased size of central right lower lobe nodule, now 13 x 16 mm. No findings suspicious for metastatic disease in the abdomen or pelvis.  Patient endorses significant improvement of head aches and vision with steroids. He currently notes no speech issues but does not some difficulty with balance.          PAST MEDICAL/SURGICAL HISTORY:   (Detailed)    Disease/disorder Onset Date Management Date Comments    Appendectomy    Colon and Throat cancer    JMP 02/18/2018 -  Gerd      Heart Attack    JMP 02/18/2018 -  Hypertension         PAST MEDICAL HISTORY, SURGICAL HISTORY, FAMILY HISTORY, SOCIAL HISTORY AND REVIEW OF SYSTEMS I have reviewed the patient's past medical, surgical, family and social history as well as the comprehensive review of systems as included on the [facility] history form dated 02/18/2018, which I have signed.  Family History:  (Detailed) Patient reports there is no relevant family history.     Social History:  (Detailed) Tobacco use reviewed. Preferred language is [place].   Tobacco use status: Very heavy cigarette smoker (40+ cigs/day). Smoking status: Heavy tobacco smoker.  SMOKING STATUS Type Smoking Status Usage Per Day Years Used Total Pack Years  Cigarette Heavy tobacco smoker 2 Packs     TOBACCO CESSATION INFORMATION Date Counseled By Order Status Description Code Tobacco Cessation Information  02/18/2018 Basil Dess. Prevatt Tobacco cessation counseling completed   Smoking cessation education   TOBACCO/VAPING EXPOSURE No passive vaping  exposure. No passive smoke exposure.       MEDICATIONS: (added, continued or stopped this visit) Started Medication Directions Instruction Stopped   Aspir-81 81 mg tablet,delayed release take 1 tablet by oral route  every day     cholesterol medication  BUCCAL   02/18/2018   Coreg 12.5 mg tablet take 1 tablet by oral route 2 times every day with food      Lipitor 40 mg tablet take 1 tablet by oral route  every day       ALLERGIES: Ingredient Reaction Medication Name Comment  NO KNOWN ALLERGIES     No known allergies. Reviewed, updated.    PHYSICAL EXAM:   Vitals Date Temp F BP Pulse Ht In Wt Lb BMI BSA Pain Score  02/18/2018  131/91 65 71 154 21.48  0/10    PHYSICAL EXAM Details General Level of Distress: no acute distress Overall Appearance: normal  Head and Face  Right Left  Fundoscopic Exam:  normal normal    Cardiovascular Cardiac: regular rate and rhythm without murmur  Right Left  Carotid Pulses: normal normal  Respiratory Lungs: clear to auscultation  Neurological Orientation: normal Recent and Remote Memory: normal Attention Span and Concentration:   normal Language: normal Fund of Knowledge: normal  Right Left Sensation: normal normal Upper Extremity Coordination: normal normal  Lower Extremity Coordination: normal normal  Musculoskeletal Gait and Station: normal  Right Left Upper Extremity Muscle Strength: normal normal Lower Extremity Muscle Strength: normal normal Upper Extremity Muscle Tone:  normal normal Lower Extremity Muscle Tone: normal normal   Motor Strength Upper and lower extremity motor strength was tested in the clinically pertinent muscles.     Deep Tendon Reflexes  Right Left Biceps: normal normal Triceps: normal normal Brachioradialis: normal normal Patellar: normal normal Achilles: normal normal  Cranial Nerves II. Optic Nerve/Visual Fields: normal III. Oculomotor: normal IV. Trochlear: normal V. Trigeminal: normal VI. Abducens: normal VII. Facial: normal VIII. Acoustic/Vestibular: normal IX. Glossopharyngeal: normal X. Vagus: normal XI. Spinal Accessory: normal XII. Hypoglossal: normal  Motor and other Tests Lhermittes: negative Rhomberg: negative Pronator  drift: absent     Right Left Hoffman's: normal normal Clonus: normal normal Babinski: normal normal   Additional Findings:  Upon examination, ptosis in left eye, torn AC joint in right arm, poor balance and incoordination make walking difficult - patient is currently utilizing a wheel chair.    IMPRESSION:   Head MRI with and without contrast reveals cerebellar mass. SRS is currently scheduled for Monday which will be followed by surgery Tuesday. Discussed methodology and recovery of surgery with patient.  Upon examination, ptosis in left eye, torn AC joint in right arm, poor balance and incoordination make walking difficult - patient is currently utilizing a wheel chair.  PLAN:  1. SRS Monday followed by surgery Tuesday 2. Follow-up after surgery for staple removal  Orders: Instruction(s)/Education: Assessment Instruction   Tobacco cessation counseling  I10 Hypertension education   Completed Orders (this encounter) Order Details Reason Side Interpretation Result Initial Treatment Date Region  Tobacco cessation counseling         Hypertension education Follow up with Primary Care Physician for elevated blood pressure.         Assessment/Plan   # Detail Type Description   1. 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       2. 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       3. Assessment Ataxia (R27.0).  4. Assessment Essential (primary) hypertension (I10).         Pain Management Plan Pain Scale: 0/10. Method: Numeric Pain Intensity Scale. Location: head. Onset: 02/04/2018. Duration: varies. Quality: discomforting. Pain management follow-up plan of care: Patient changes positions often for pain relief..              Provider:  Marchia Meiers. Vertell Limber MD  02/18/2018 04:43 PM Dictation edited by: Mirian Mo    CC Providers: Pietro Cassis [facility] [address] [place],  [place]  [address]   [address]  [place], [place] [address]               Electronically signed by Marchia Meiers. Vertell Limber MD on 02/18/2018 04:43 PM

## 2018-02-24 NOTE — Brief Op Note (Signed)
02/24/2018  11:10 AM  PATIENT:  Alexander Monroe  68 y.o. 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  PROCEDURE:  Procedure(s) with comments: Suboccipital craniotomy for tumor excision with brainlab (N/A) - suboccipital APPLICATION OF CRANIAL NAVIGATION (N/A)  SURGEON:  Surgeon(s) and Role:    Erline Levine, MD - Primary    * Consuella Lose, MD - Assisting  PHYSICIAN ASSISTANT:   ASSISTANTS: Poteat, RN   ANESTHESIA:   general  EBL:  200 mL   BLOOD ADMINISTERED:none  DRAINS: none   LOCAL MEDICATIONS USED:  MARCAINE    and LIDOCAINE   SPECIMEN:  Excision  DISPOSITION OF SPECIMEN:  PATHOLOGY  COUNTS:  YES  TOURNIQUET:  * No tourniquets in log *  DICTATION: Patient is 67 year old man with a large cerebellar metastatic brain tumor.  He has non-small cell lung cancer.  Patient has undergone stereotactic radiosurgery to this solitary metastasis.  It was elected to take patient to surgery for sub-occipital craniectomy for metastasis with BrainLab.  Procedure:  Following smooth intubation, patient was placed in prone position on chest rolls with 3 pin head fixation. Scalp was registered with [place].  Occipital region was shaved and prepped and draped in usual sterile fashion with betadine scrub and Duraprep.  Area of planned incision was infiltrated with lidocaine. A linear incision was made in the suboccipital region to expose calvarium. High speed drill was used to perform bilateral suboccipital craniectomy to expose the dura.  Dura was opened over the midline and over both cerebellar hemispheres.  A corticotomy was created in the left cerebellar hemisphere and carried to the tumor.  This was dense and whitish in color and we ere able to dissect this from surrounding cerebellum.  It was quite vascular.  We performed micro-dissection and gross total removal under the operating microscope.  We  used suction cautery technique to debulk the tumor using the operating microscope.  The entire tumor was removed.   Hemostasis was assured with irrigation and surgifoam.  Hemostasis was assured also with Surgifoam.  The brain was considerably more relaxed after tumor resection.  The evacuation cavity was lined with Surgicell. The dura was patched with Dura-matrix and  the fascia was closed with 0 vicryl sutures, subcutaneous tissues were reapproximated with 2-0 vicryl sutures and the skin was re approximated with a running 3-0 Nylon stitch.  A sterile occlusive dressing was placed.  Patient was returned to a supine position and taken out of pins and extubated in the operating room and taken to Recovery having tolerated her surgery well.  Counts were correct at the end of the case.   PLAN OF CARE: Admit to inpatient   PATIENT DISPOSITION:  PACU - hemodynamically stable.   Delay start of Pharmacological VTE agent (>24hrs) due to surgical blood loss or risk of bleeding: yes

## 2018-02-24 NOTE — Progress Notes (Signed)
Patient is awake, conversant, MAW without significant discomfort.  He is doing well.

## 2018-02-24 NOTE — Anesthesia Procedure Notes (Signed)
Arterial Line Insertion Start/End12/12/2017 7:05 AM, 02/24/2018 7:10 AM Performed by: Orlie Dakin, CRNA, CRNA  Patient location: Pre-op. Preanesthetic checklist: patient identified, IV checked, risks and benefits discussed, surgical consent, monitors and equipment checked and pre-op evaluation Lidocaine 1% used for infiltration and patient sedated Right, radial was placed Catheter size: 20 G Hand hygiene performed  and maximum sterile barriers used   Attempts: 1 Procedure performed without using ultrasound guided technique. Following insertion, dressing applied and Biopatch. Post procedure assessment: normal  Patient tolerated the procedure well with no immediate complications.

## 2018-02-24 NOTE — Transfer of Care (Signed)
Immediate Anesthesia Transfer of Care Note  Patient: Alexander Monroe  Procedure(s) Performed: Suboccipital craniotomy for tumor excision with brainlab (N/A Head) APPLICATION OF CRANIAL NAVIGATION (N/A )  Patient Location: PACU  Anesthesia Type:General  Level of Consciousness: awake  Airway & Oxygen Therapy: Patient Spontanous Breathing and Patient connected to face mask oxygen  Post-op Assessment: Report given to RN, Post -op Vital signs reviewed and stable and Patient moving all extremities X 4  Post vital signs: Reviewed and stable  Last Vitals:  Vitals Value Taken Time  BP 147/106 02/24/2018 11:19 AM  Temp    Pulse 69 02/24/2018 11:28 AM  Resp 14 02/24/2018 11:28 AM  SpO2 100 % 02/24/2018 11:28 AM  Vitals shown include unvalidated device data.  Last Pain:  Vitals:   02/24/18 0630  TempSrc:   PainSc: 5       Patients Stated Pain Goal: 3 (82/57/[id number])  Complications: No apparent anesthesia complications

## 2018-02-24 NOTE — Interval H&P Note (Signed)
History and Physical Interval Note:  02/24/2018 7:32 AM  Alexander Monroe  has presented today for surgery, with the diagnosis of Brain Tumor  The various methods of treatment have been discussed with the patient and family. After consideration of risks, benefits and other options for treatment, the patient has consented to  Procedure(s) with comments: Suboccipital craniotomy for tumor excision with brainlab (N/A) - Suboccipital craniotomy for tumor excision with brainlab [place] (N/A) as a surgical intervention .  The patient's history has been reviewed, patient examined, no change in status, stable for surgery.  I have reviewed the patient's chart and labs.  Questions were answered to the patient's satisfaction.     Peggyann Shoals

## 2018-02-24 NOTE — Progress Notes (Signed)
ART line would not draw back blood and no longer reading a correct waveform on assessment. Unable to manipulate it to get it to work correctly. Removed and placed pressure dressing.   Patient would like port-a-cath to be accessed for any blood draws while in hospital. Per IV team CXR needs to be ordered to check placement since it was placed in OSH. Order placed per protocol.

## 2018-02-24 NOTE — Interval H&P Note (Signed)
History and Physical Interval Note:  02/24/2018 7:43 AM  Alexander Monroe  has presented today for surgery, with the diagnosis of Brain Tumor  The various methods of treatment have been discussed with the patient and family. After consideration of risks, benefits and other options for treatment, the patient has consented to  Procedure(s) with comments: Suboccipital craniotomy for tumor excision with brainlab (N/A) - Suboccipital craniotomy for tumor excision with brainlab [name] (N/A) as a surgical intervention .  The patient's history has been reviewed, patient examined, no change in status, stable for surgery.  I have reviewed the patient's chart and labs.  Questions were answered to the patient's satisfaction.     Peggyann Shoals

## 2018-02-24 NOTE — Progress Notes (Signed)
Awake, alert, conversant.  MAEW with good strength.  Doing well. 

## 2018-02-24 NOTE — Anesthesia Procedure Notes (Signed)
Procedure Name: Intubation Date/Time: 02/24/2018 7:49 AM Performed by: Orlie Dakin, CRNA Pre-anesthesia Checklist: Patient identified, Emergency Drugs available, Suction available and Patient being monitored Patient Re-evaluated:Patient Re-evaluated prior to induction Oxygen Delivery Method: Circle system utilized Preoxygenation: Pre-oxygenation with 100% oxygen Induction Type: IV induction Ventilation: Mask ventilation without difficulty and Oral airway inserted - appropriate to patient size Laryngoscope Size: Glidescope and 4 Grade View: Grade I Tube type: Oral Tube size: 7.5 mm Number of attempts: 2 Airway Equipment and Method: Stylet and Video-laryngoscopy Placement Confirmation: ETT inserted through vocal cords under direct vision,  positive ETCO2 and breath sounds checked- equal and bilateral Secured at: 22 cm Tube secured with: Tape Dental Injury: Teeth and Oropharynx as per pre-operative assessment  Comments: 1st DL Miller 3, unable to control large, thick tongue to visualize.  Glidescope 4 used as noted above, Grade 1 view.

## 2018-02-24 NOTE — Op Note (Signed)
02/24/2018  11:10 AM  PATIENT:  Alexander Monroe  67 y.o. 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  PROCEDURE:  Procedure(s) with comments: Suboccipital craniotomy for tumor excision with brainlab (N/A) - suboccipital APPLICATION OF CRANIAL NAVIGATION (N/A)  SURGEON:  Surgeon(s) and Role:    Erline Levine, MD - Primary    * Consuella Lose, MD - Assisting  PHYSICIAN ASSISTANT:   ASSISTANTS: Poteat, RN   ANESTHESIA:   general  EBL:  200 mL   BLOOD ADMINISTERED:none  DRAINS: none   LOCAL MEDICATIONS USED:  MARCAINE    and LIDOCAINE   SPECIMEN:  Excision  DISPOSITION OF SPECIMEN:  PATHOLOGY  COUNTS:  YES  TOURNIQUET:  * No tourniquets in log *  DICTATION: Patient is 67 year old man with a large cerebellar metastatic brain tumor.  He has non-small cell lung cancer.  Patient has undergone stereotactic radiosurgery to this solitary metastasis.  It was elected to take patient to surgery for sub-occipital craniectomy for metastasis with BrainLab.  Procedure:  Following smooth intubation, patient was placed in prone position on chest rolls with 3 pin head fixation. Scalp was registered with [place].  Occipital region was shaved and prepped and draped in usual sterile fashion with betadine scrub and Duraprep.  Area of planned incision was infiltrated with lidocaine. A linear incision was made in the suboccipital region to expose calvarium. High speed drill was used to perform bilateral suboccipital craniectomy to expose the dura.  Dura was opened over the midline and over both cerebellar hemispheres.  A corticotomy was created in the left cerebellar hemisphere and carried to the tumor.  This was dense and whitish in color and we ere able to dissect this from surrounding cerebellum.  It was quite vascular.  We performed micro-dissection and gross total removal under the operating microscope.  We  used suction cautery technique to debulk the tumor using the operating microscope.  The entire tumor was removed.   Hemostasis was assured with irrigation and surgifoam.  Hemostasis was assured also with Surgifoam.  The brain was considerably more relaxed after tumor resection.  The evacuation cavity was lined with Surgicell. The dura was patched with Dura-matrix and  the fascia was closed with 0 vicryl sutures, subcutaneous tissues were reapproximated with 2-0 vicryl sutures and the skin was re approximated with a running 3-0 Nylon stitch.  A sterile occlusive dressing was placed.  Patient was returned to a supine position and taken out of pins and extubated in the operating room and taken to Recovery having tolerated her surgery well.  Counts were correct at the end of the case.   PLAN OF CARE: Admit to inpatient   PATIENT DISPOSITION:  PACU - hemodynamically stable.   Delay start of Pharmacological VTE agent (>24hrs) due to surgical blood loss or risk of bleeding: yes

## 2018-02-24 NOTE — H&P (View-Only) (Signed)
Patient ID:   [id number] Patient: Alexander Monroe  Date of Birth: [birth year] Visit Type: Office Visit   Date: 02/18/2018 12:30 PM Provider: Marchia Meiers. Vertell Limber MD   This 67 year old male presents for Headaches and loss balance and vomiting.  HISTORY OF PRESENT ILLNESS:  1.  Headaches  2.  loss balance and vomiting  Alexander Monroe is a 67 y.o. male who has a history of colon cancer in 2013 and a history of squamous cell carcinoma of the tonsil in 2015. In January 2019 the patient was diagnosed with lung cancer. His history of lung cancer dates back to August 2018 when his PCP at the [facility] recommended that he undergo CT scans for occult cancer detection, particularly as he had both colon cancer and tonsillar cancer. These CT scans incidentally showed a right lower lobe lung mass and suspicious hilar lymphadenopathy. These findings were further confirmed by a PET scan that was done in October 2018. The patient was sent to [facility] to undergo an endobronchial ultrasound in January 2019. A biopsy of 1 of the lymph nodes in his hilar region came back consistent with high-grade carcinoma with neuroendocrine features. He was referred to [facility] in March 2019 where a PET scan on 06/04/17 showed a 3.2 x 3.9 cm right lower lobe mass, compatible with primary bronchogenic neoplasm, with scattered satellite nodularity in the right lower lobe, indeterminate. There was also mediastinal and right hilar nodal metastases and a possible left hilar nodal metastasis. MRI of the head at that time showed no evidence for metastatic disease in the brain. He has received 11 cycles of maintenance durvalumab immunotherapy under the care and direction of Dr. Bobby Rumpf. He was doing well until recently he reported headaches, nausea, and balance issues. MRI of the brain on 02/10/17 demonstrated large metastatic deposits in the cerebellum with surrounding edema and mass-effect CT C/A/P on  02/11/18 showed decreased size of central right lower lobe nodule, now 13 x 16 mm. No findings suspicious for metastatic disease in the abdomen or pelvis.  Patient endorses significant improvement of head aches and vision with steroids. He currently notes no speech issues but does not some difficulty with balance.          PAST MEDICAL/SURGICAL HISTORY:   (Detailed)    Disease/disorder Onset Date Management Date Comments    Appendectomy    Colon and Throat cancer    JMP 02/18/2018 -  Gerd      Heart Attack    JMP 02/18/2018 -  Hypertension         PAST MEDICAL HISTORY, SURGICAL HISTORY, FAMILY HISTORY, SOCIAL HISTORY AND REVIEW OF SYSTEMS I have reviewed the patient's past medical, surgical, family and social history as well as the comprehensive review of systems as included on the [facility] history form dated 02/18/2018, which I have signed.  Family History:  (Detailed) Patient reports there is no relevant family history.     Social History:  (Detailed) Tobacco use reviewed. Preferred language is [place].   Tobacco use status: Very heavy cigarette smoker (40+ cigs/day). Smoking status: Heavy tobacco smoker.  SMOKING STATUS Type Smoking Status Usage Per Day Years Used Total Pack Years  Cigarette Heavy tobacco smoker 2 Packs     TOBACCO CESSATION INFORMATION Date Counseled By Order Status Description Code Tobacco Cessation Information  02/18/2018 Basil Dess. Prevatt Tobacco cessation counseling completed   Smoking cessation education   TOBACCO/VAPING EXPOSURE No passive vaping  exposure. No passive smoke exposure.       MEDICATIONS: (added, continued or stopped this visit) Started Medication Directions Instruction Stopped   Aspir-81 81 mg tablet,delayed release take 1 tablet by oral route  every day     cholesterol medication  BUCCAL   02/18/2018   Coreg 12.5 mg tablet take 1 tablet by oral route 2 times every day with food      Lipitor 40 mg tablet take 1 tablet by oral route  every day       ALLERGIES: Ingredient Reaction Medication Name Comment  NO KNOWN ALLERGIES     No known allergies. Reviewed, updated.    PHYSICAL EXAM:   Vitals Date Temp F BP Pulse Ht In Wt Lb BMI BSA Pain Score  02/18/2018  131/91 65 71 154 21.48  0/10    PHYSICAL EXAM Details General Level of Distress: no acute distress Overall Appearance: normal  Head and Face  Right Left  Fundoscopic Exam:  normal normal    Cardiovascular Cardiac: regular rate and rhythm without murmur  Right Left  Carotid Pulses: normal normal  Respiratory Lungs: clear to auscultation  Neurological Orientation: normal Recent and Remote Memory: normal Attention Span and Concentration:   normal Language: normal Fund of Knowledge: normal  Right Left Sensation: normal normal Upper Extremity Coordination: normal normal  Lower Extremity Coordination: normal normal  Musculoskeletal Gait and Station: normal  Right Left Upper Extremity Muscle Strength: normal normal Lower Extremity Muscle Strength: normal normal Upper Extremity Muscle Tone:  normal normal Lower Extremity Muscle Tone: normal normal   Motor Strength Upper and lower extremity motor strength was tested in the clinically pertinent muscles.     Deep Tendon Reflexes  Right Left Biceps: normal normal Triceps: normal normal Brachioradialis: normal normal Patellar: normal normal Achilles: normal normal  Cranial Nerves II. Optic Nerve/Visual Fields: normal III. Oculomotor: normal IV. Trochlear: normal V. Trigeminal: normal VI. Abducens: normal VII. Facial: normal VIII. Acoustic/Vestibular: normal IX. Glossopharyngeal: normal X. Vagus: normal XI. Spinal Accessory: normal XII. Hypoglossal: normal  Motor and other Tests Lhermittes: negative Rhomberg: negative Pronator  drift: absent     Right Left Hoffman's: normal normal Clonus: normal normal Babinski: normal normal   Additional Findings:  Upon examination, ptosis in left eye, torn AC joint in right arm, poor balance and incoordination make walking difficult - patient is currently utilizing a wheel chair.    IMPRESSION:   Head MRI with and without contrast reveals cerebellar mass. SRS is currently scheduled for Monday which will be followed by surgery Tuesday. Discussed methodology and recovery of surgery with patient.  Upon examination, ptosis in left eye, torn AC joint in right arm, poor balance and incoordination make walking difficult - patient is currently utilizing a wheel chair.  PLAN:  1. SRS Monday followed by surgery Tuesday 2. Follow-up after surgery for staple removal  Orders: Instruction(s)/Education: Assessment Instruction   Tobacco cessation counseling  I10 Hypertension education   Completed Orders (this encounter) Order Details Reason Side Interpretation Result Initial Treatment Date Region  Tobacco cessation counseling         Hypertension education Follow up with Primary Care Physician for elevated blood pressure.         Assessment/Plan   # Detail Type Description   1. 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       2. 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       3. Assessment Ataxia (R27.0).  4. Assessment Essential (primary) hypertension (I10).         Pain Management Plan Pain Scale: 0/10. Method: Numeric Pain Intensity Scale. Location: head. Onset: 02/04/2018. Duration: varies. Quality: discomforting. Pain management follow-up plan of care: Patient changes positions often for pain relief..              Provider:  Marchia Meiers. Vertell Limber MD  02/18/2018 04:43 PM Dictation edited by: Mirian Mo    CC Providers: Pietro Cassis [facility] [address] [place],  [place]  [id number]-   Sarah  [address]  [place], [place] [address]               Electronically signed by Marchia Meiers. Vertell Limber MD on 02/18/2018 04:43 PM

## 2018-02-25 ENCOUNTER — Other Ambulatory Visit: Payer: Self-pay | Admitting: Radiation Therapy

## 2018-02-25 ENCOUNTER — Encounter (HOSPITAL_COMMUNITY): Payer: Self-pay | Admitting: Neurosurgery

## 2018-02-25 ENCOUNTER — Inpatient Hospital Stay (HOSPITAL_COMMUNITY): Payer: Medicare Other

## 2018-02-25 MED ORDER — GADOBUTROL 1 MMOL/ML IV SOLN
7.0000 mL | Freq: Once | INTRAVENOUS | Status: AC | PRN
  Administered 2018-02-25: 7 mL via INTRAVENOUS

## 2018-02-25 MED ORDER — HYDROCODONE-ACETAMINOPHEN 5-325 MG PO TABS: 1.0000 | ORAL_TABLET | ORAL | 0 refills | Status: AC | PRN

## 2018-02-25 MED ORDER — HEPARIN SOD (PORK) LOCK FLUSH 100 UNIT/ML IV SOLN
500.0000 [IU] | INTRAVENOUS | Status: AC | PRN
  Administered 2018-02-25: 500 [IU]

## 2018-02-25 NOTE — Progress Notes (Signed)
Alexander Monroe discharged home per MD order. Discharge instructions reviewed and discussed with the patient and his partner Alexander Monroe, all questions and concerns answered. Copy of instructions and scripts given to patient.  Allergies as of 02/25/2018   No Known Allergies     Medication List    TAKE these medications   aspirin EC 81 MG tablet Take 81 mg by mouth daily.   atorvastatin 40 MG tablet Commonly known as:  LIPITOR Take 40 mg by mouth at bedtime.   carvedilol 12.5 MG tablet Commonly known as:  COREG Take 6.25 mg by mouth 2 (two) times daily with a meal.   dexamethasone 4 MG tablet Commonly known as:  DECADRON Take 4 mg by mouth 3 (three) times daily.   HYDROcodone-acetaminophen 5-325 MG tablet Commonly known as:  NORCO/VICODIN Take 1 tablet by mouth every 4 (four) hours as needed for moderate pain.   omeprazole 20 MG capsule Commonly known as:  PRILOSEC Take 20 mg by mouth daily.   ondansetron 4 MG tablet Commonly known as:  ZOFRAN Take 4 mg by mouth every 4 (four) hours as needed for nausea or vomiting.       Patients skin is clean, dry and intact, no evidence of skin break down. IV site discontinued and catheter remains intact. Site without signs and symptoms of complications. Dressing and pressure applied.  Patient escorted to car by Lovena Le, RN in a wheelchair,  no distress noted upon discharge.  Darnelle Maffucci 02/25/2018 2:31 PM

## 2018-02-25 NOTE — Progress Notes (Signed)
Subjective: Patient reports doing well  Objective: Vital signs in last 24 hours: Temp:  [97.7 F (36.5 C)-98.6 F (37 C)] 98.3 F (36.8 C) (12/11 0400) Pulse Rate:  [55-80] 61 (12/11 0700) Resp:  [10-28] 11 (12/11 0700) BP: (99-147)/(58-106) 119/88 (12/11 0700) SpO2:  [95 %-100 %] 97 % (12/11 0700) Arterial Line BP: (67-145)/(55-98) 107/98 (12/10 2000) Weight:  [70.5 kg] 70.5 kg (12/10 1730)  Intake/Output from previous day: 12/10 0701 - 12/11 0700 In: 3029.7 [I.V.:2778; IV Piggyback:251.7] Out: 1650 [Urine:1450; Blood:200] Intake/Output this shift: No intake/output data recorded.  Physical Exam: Awake, alert, conversant.  Sore in neck.  Dressing CDI.  PERRL, EOMI.  MAEW without weakness.  Speech and swallowing stable.  Lab Results: No results for input(s): WBC, HGB, HCT, PLT in the last 72 hours. BMET No results for input(s): NA, K, CL, CO2, GLUCOSE, BUN, CREATININE, CALCIUM in the last 72 hours.  Studies/Results: Dg Chest Port 1 View  Result Date: 02/24/2018 CLINICAL DATA:  Port-A-Cath EXAM: PORTABLE CHEST 1 VIEW COMPARISON:  CT 02/11/2018, radiograph 06/18/2017 FINDINGS: Right-sided central venous port tip over the proximal right atrium. No pneumothorax. Stable right infrahilar opacity, corresponding to nodule and surrounding post treatment changes on recent CT. Normal heart size. Aortic atherosclerosis. IMPRESSION: 1. Right-sided central venous port tip projects over the proximal right atrium 2. Similar appearance of right infrahilar slightly spiculated opacity, felt to correspond to the CT demonstrated nodule with surrounding ground-glass density/post treatment changes. Electronically Signed   By: Donavan Foil M.D.   On: 02/24/2018 21:44    Assessment/Plan: Patient is doing well.  Discharge home after mobilize and D/C Foley.  Post-op MRI today.    LOS: 1 day    Peggyann Shoals, MD 02/25/2018, 7:43 AM

## 2018-02-25 NOTE — Anesthesia Postprocedure Evaluation (Signed)
Anesthesia Post Note  Patient: Alexander Monroe  Procedure(s) Performed: Suboccipital craniotomy for tumor excision with brainlab (N/A Head) APPLICATION OF CRANIAL NAVIGATION (N/A )     Patient location during evaluation: PACU Anesthesia Type: General Level of consciousness: awake and patient cooperative Pain management: pain level controlled Vital Signs Assessment: post-procedure vital signs reviewed and stable Respiratory status: spontaneous breathing, nonlabored ventilation, respiratory function stable and patient connected to nasal cannula oxygen Cardiovascular status: blood pressure returned to baseline and stable Postop Assessment: no apparent nausea or vomiting Anesthetic complications: no    Last Vitals:  Vitals:   02/25/18 1000 02/25/18 1200  BP: (!) 93/59   Pulse: 87   Resp: 13   Temp:  36.7 C  SpO2: 97%     Last Pain:  Vitals:   02/25/18 1200  TempSrc: Oral  PainSc:                  Carolee Channell

## 2018-02-25 NOTE — Discharge Summary (Signed)
Physician Discharge Summary  Patient ID: Alexander Monroe MRN: [id number] DOB/AGE: June 01, 1950 67 y.o.  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  He did well with surgery and was discharged home on AM of POD 1.   Consults: None  Significant Diagnostic Studies: None  Treatments: surgery: Patient underwent suboccipital craniectomy for cerebellar metastasis  Discharge Exam: Blood pressure 119/88, pulse 61, temperature 98.3 F (36.8 C), temperature source Oral, resp. rate 11, height 5\' 11"  (1.803 m), weight 70.5 kg, SpO2 97 %. Neurologic: Alert and oriented X 3, normal strength and tone. Normal symmetric reflexes. Normal coordination and gait Wound:CDI  Disposition: Home  Discharge Instructions    Diet - low sodium heart healthy   Complete by:  As directed    Increase activity slowly   Complete by:  As directed      Allergies as of 02/25/2018   No Known Allergies     Medication List    TAKE these medications   aspirin EC 81 MG tablet Take 81 mg by mouth daily.   atorvastatin 40 MG tablet Commonly known as:  LIPITOR Take 40 mg by mouth at bedtime.   carvedilol 12.5 MG tablet Commonly known as:  COREG Take 6.25 mg by mouth 2 (two) times daily with a meal.   dexamethasone 4 MG tablet Commonly known as:  DECADRON Take 4 mg by mouth 3 (three) times daily.   HYDROcodone-acetaminophen 5-325 MG tablet Commonly known as:  NORCO/VICODIN Take 1 tablet by mouth every 4 (four) hours as needed for moderate pain.   omeprazole 20 MG capsule Commonly known as:  PRILOSEC Take 20 mg by mouth daily.   ondansetron 4 MG tablet Commonly known as:  ZOFRAN Take 4 mg by mouth every 4  (four) hours as needed for nausea or vomiting.        Signed: Peggyann Shoals, MD 02/25/2018, 7:45 AM

## 2018-03-02 ENCOUNTER — Other Ambulatory Visit: Payer: Self-pay | Admitting: Neurosurgery

## 2018-03-02 ENCOUNTER — Other Ambulatory Visit: Payer: Medicare Other

## 2018-03-03 ENCOUNTER — Encounter: Payer: Self-pay | Admitting: Radiation Oncology

## 2018-03-03 NOTE — Progress Notes (Signed)
  Radiation Oncology         [phone] ________________________________  Name: Alexander Monroe MRN: [id number]  Date: 03/03/2018  DOB: 04/11/1950  End of Treatment Note  Diagnosis: Metastasis to brain  Indication for treatment:  Palliative        Radiation treatment dates:   02/23/2018  Site/dose:   Brain, 14 Gy in 1 fraction   Beams/energy:   Photon, SBRT/SRT-VMAT, 6FFF;  ExacTrac, 4 vmat beams max dose 129.7% PTV1 Rt Med Cerebellum 48mm  Narrative: The patient tolerated pre-operative radiatiosurgery relatively well.   Plan: The patient has completed radiation treatment. The patient will return to radiation oncology clinic for routine followup in one month. I advised them to call or return sooner if they have any questions or concerns related to their recovery or treatment.  -----------------------------------  Eppie Gibson, MD  This document serves as a record of services personally performed by Eppie Gibson, MD. It was created on her behalf by Steva Colder, a trained medical scribe. The creation of this record is based on the scribe's personal observations and the provider's statements to them. This document has been checked and approved by the attending provider.

## 2018-03-04 ENCOUNTER — Encounter (HOSPITAL_COMMUNITY): Payer: Self-pay | Admitting: *Deleted

## 2018-03-04 NOTE — Progress Notes (Signed)
  Radiation Oncology         [phone] ________________________________  Outpatient  C79.31 Brain metastasis  Stereotactic Treatment Procedure Note  Name: Alexander Monroe MRN: [id number]  Date: 02/23/2018  DOB: 12-18-1950  SPECIAL TREATMENT PROCEDURE  3D TREATMENT PLANNING AND DOSIMETRY:  The patient's radiation plan was reviewed and approved by neurosurgery and radiation oncology prior to treatment.  It showed 3-dimensional radiation distributions overlaid onto the planning CT/MRI image set.  The [facility] for the target structures as well as the organs at risk were reviewed. The documentation of the 3D plan and dosimetry are filed in the radiation oncology EMR.  NARRATIVE:  Alexander Monroe was brought to the TrueBeam stereotactic radiation treatment machine and placed supine on the CT couch. The head frame was applied, and the patient was set up for stereotactic radiosurgery.  Neurosurgery was present for the set-up and delivery  SIMULATION VERIFICATION:  In the couch zero-angle position, the patient underwent Exactrac imaging using the Brainlab system with orthogonal KV images.  These were carefully aligned and repeated to confirm treatment position for each of the isocenters.  The Exactrac snap film verification was repeated at each couch angle.  SPECIAL TREATMENT PROCEDURE: Alexander Monroe received stereotactic radiosurgery to the following targets: PTV1 Rt Med Cerebellum 84mm received a pre-operative dose of 14 Gy in 1 fraction with 4 vmat beams, max dose 129.7%.  ExacTrac Snap verification was performed for each couch angle.  This constitutes a special treatment procedure due to the ablative dose delivered and the technical nature of treatment.  This highly technical modality of treatment ensures that the ablative dose is centered on the patient's tumor while sparing normal tissues from excessive dose and risk of detrimental effects.  STEREOTACTIC TREATMENT MANAGEMENT:  Following  delivery, the patient was transported to nursing in stable condition and monitored for possible acute effects.  Vital signs were recorded BP (!) 119/95 (BP Location: Right Arm)   Pulse 61   Temp 97.7 F (36.5 C) (Oral)   SpO2 100% Comment: room air. The patient tolerated treatment without significant acute effects, and was discharged to home in stable condition.    PLAN: Surgery tomorrow. Follow-up with me in one month.  ________________________________   Eppie Gibson, MD

## 2018-03-04 NOTE — Progress Notes (Signed)
Pt stated " I just woke up , everything is still the same from last week- can you just give me instructions."  Pre-op instructions provided according to pre-op checklist. Pt stated that his last dose of Aspirin was Tuesday. Pt made aware to stop taking vitamins, fish oil and herbal medications.  Do not take any NSAIDs ie: Ibuprofen, Advil, Naproxen (Aleve), Motrin, BC and Goody Powder. Pt verbalized understanding of all pre-op instructions.

## 2018-03-05 ENCOUNTER — Inpatient Hospital Stay (HOSPITAL_COMMUNITY): Payer: Medicare Other

## 2018-03-05 ENCOUNTER — Encounter (HOSPITAL_COMMUNITY)
Admission: RE | Disposition: A | Discharge status: Home / Self Care | Payer: Self-pay | Source: Home / Self Care | Attending: Neurosurgery

## 2018-03-05 ENCOUNTER — Other Ambulatory Visit: Payer: Self-pay

## 2018-03-05 ENCOUNTER — Encounter (HOSPITAL_COMMUNITY): Payer: Self-pay

## 2018-03-05 ENCOUNTER — Inpatient Hospital Stay (HOSPITAL_COMMUNITY)
Admission: RE | Admit: 2018-03-05 | Discharge: 2018-03-06 | DRG: 026 | Disposition: A | Discharge status: Home / Self Care | Payer: Medicare Other | Attending: Neurosurgery | Admitting: Neurosurgery

## 2018-03-05 DIAGNOSIS — C771 Secondary and unspecified malignant neoplasm of intrathoracic lymph nodes: Secondary | ICD-10-CM | POA: Diagnosis present

## 2018-03-05 DIAGNOSIS — R270 Ataxia, unspecified: Secondary | ICD-10-CM | POA: Diagnosis present

## 2018-03-05 DIAGNOSIS — Z7982 Long term (current) use of aspirin: Secondary | ICD-10-CM | POA: Diagnosis not present

## 2018-03-05 DIAGNOSIS — I10 Essential (primary) hypertension: Secondary | ICD-10-CM | POA: Diagnosis present

## 2018-03-05 DIAGNOSIS — F17210 Nicotine dependence, cigarettes, uncomplicated: Secondary | ICD-10-CM | POA: Diagnosis present

## 2018-03-05 DIAGNOSIS — C7931 Secondary malignant neoplasm of brain: Principal | ICD-10-CM | POA: Diagnosis present

## 2018-03-05 DIAGNOSIS — K219 Gastro-esophageal reflux disease without esophagitis: Secondary | ICD-10-CM | POA: Diagnosis present

## 2018-03-05 DIAGNOSIS — R51 Headache: Secondary | ICD-10-CM | POA: Diagnosis present

## 2018-03-05 DIAGNOSIS — Z79899 Other long term (current) drug therapy: Secondary | ICD-10-CM | POA: Diagnosis not present

## 2018-03-05 DIAGNOSIS — C3490 Malignant neoplasm of unspecified part of unspecified bronchus or lung: Secondary | ICD-10-CM | POA: Diagnosis present

## 2018-03-05 DIAGNOSIS — D496 Neoplasm of unspecified behavior of brain: Secondary | ICD-10-CM | POA: Diagnosis present

## 2018-03-05 HISTORY — PX: CRANIOTOMY: SHX93

## 2018-03-05 HISTORY — DX: Neoplasm of unspecified behavior of brain: D49.6

## 2018-03-05 HISTORY — PX: APPLICATION OF CRANIAL NAVIGATION: SHX6578

## 2018-03-05 LAB — POCT I-STAT 7, (LYTES, BLD GAS, ICA,H+H)
Acid-base deficit: 4 mmol/L — ABNORMAL HIGH (ref 0.0–2.0)
Bicarbonate: 21.6 mmol/L (ref 20.0–28.0)
Calcium, Ion: 1.14 mmol/L — ABNORMAL LOW (ref 1.15–1.40)
HCT: 32 % — ABNORMAL LOW (ref 39.0–52.0)
Hemoglobin: 10.9 g/dL — ABNORMAL LOW (ref 13.0–17.0)
O2 Saturation: 100 %
Potassium: 4 mmol/L (ref 3.5–5.1)
Sodium: 137 mmol/L (ref 135–145)
TCO2: 23 mmol/L (ref 22–32)
pCO2 arterial: 39.5 mmHg (ref 32.0–48.0)
pH, Arterial: 7.337 — ABNORMAL LOW (ref 7.350–7.450)
pO2, Arterial: 173 mmHg — ABNORMAL HIGH (ref 83.0–108.0)

## 2018-03-05 LAB — TYPE AND SCREEN
ABO/RH(D): A POS
Antibody Screen: NEGATIVE

## 2018-03-05 SURGERY — CRANIOTOMY TUMOR EXCISION
Anesthesia: General | Site: Head

## 2018-03-05 MED ORDER — FENTANYL CITRATE (PF) 250 MCG/5ML IJ SOLN
INTRAMUSCULAR | Status: AC
  Filled 2018-03-05: qty 5

## 2018-03-05 MED ORDER — LIDOCAINE 2% (20 MG/ML) 5 ML SYRINGE
INTRAMUSCULAR | Status: AC
  Filled 2018-03-05: qty 5

## 2018-03-05 MED ORDER — PANTOPRAZOLE SODIUM 40 MG IV SOLR
40.0000 mg | Freq: Every day | INTRAVENOUS | Status: DC
  Administered 2018-03-05: 40 mg via INTRAVENOUS
  Filled 2018-03-05: qty 40

## 2018-03-05 MED ORDER — BISACODYL 10 MG RE SUPP: 10.0000 mg | Freq: Every day | RECTAL | Status: DC | PRN

## 2018-03-05 MED ORDER — LABETALOL HCL 5 MG/ML IV SOLN: 10.0000 mg | INTRAVENOUS | Status: DC | PRN

## 2018-03-05 MED ORDER — SODIUM CHLORIDE 0.9 % IV SOLN
INTRAVENOUS | Status: DC | PRN
  Administered 2018-03-05: 07:00:00 via INTRAVENOUS

## 2018-03-05 MED ORDER — FLEET ENEMA 7-19 GM/118ML RE ENEM: 1.0000 | ENEMA | Freq: Once | RECTAL | Status: DC | PRN

## 2018-03-05 MED ORDER — HYDROCODONE-ACETAMINOPHEN 5-325 MG PO TABS: 1.0000 | ORAL_TABLET | ORAL | Status: DC | PRN

## 2018-03-05 MED ORDER — HEMOSTATIC AGENTS (NO CHARGE) OPTIME
TOPICAL | Status: DC | PRN
  Administered 2018-03-05: 1

## 2018-03-05 MED ORDER — EPHEDRINE SULFATE-NACL 50-0.9 MG/10ML-% IV SOSY
PREFILLED_SYRINGE | INTRAVENOUS | Status: DC | PRN
  Administered 2018-03-05 (×3): 5 mg via INTRAVENOUS

## 2018-03-05 MED ORDER — PROPOFOL 10 MG/ML IV BOLUS
INTRAVENOUS | Status: AC
  Filled 2018-03-05: qty 20

## 2018-03-05 MED ORDER — ACETAMINOPHEN 325 MG PO TABS: 650.0000 mg | ORAL_TABLET | ORAL | Status: DC | PRN

## 2018-03-05 MED ORDER — ROCURONIUM BROMIDE 10 MG/ML (PF) SYRINGE
PREFILLED_SYRINGE | INTRAVENOUS | Status: DC | PRN
  Administered 2018-03-05: 50 mg via INTRAVENOUS
  Administered 2018-03-05: 20 mg via INTRAVENOUS

## 2018-03-05 MED ORDER — BACITRACIN ZINC 500 UNIT/GM EX OINT
TOPICAL_OINTMENT | CUTANEOUS | Status: DC | PRN
  Administered 2018-03-05: 1 via TOPICAL

## 2018-03-05 MED ORDER — CEFAZOLIN SODIUM-DEXTROSE 2-4 GM/100ML-% IV SOLN
2.0000 g | Freq: Three times a day (TID) | INTRAVENOUS | Status: AC
  Administered 2018-03-05 – 2018-03-06 (×2): 2 g via INTRAVENOUS
  Filled 2018-03-05 (×3): qty 100

## 2018-03-05 MED ORDER — POLYETHYLENE GLYCOL 3350 17 G PO PACK: 17.0000 g | PACK | Freq: Every day | ORAL | Status: DC | PRN

## 2018-03-05 MED ORDER — PROMETHAZINE HCL 25 MG/ML IJ SOLN: 6.2500 mg | INTRAMUSCULAR | Status: DC | PRN

## 2018-03-05 MED ORDER — CHLORHEXIDINE GLUCONATE CLOTH 2 % EX PADS: 6.0000 | MEDICATED_PAD | Freq: Once | CUTANEOUS | Status: DC

## 2018-03-05 MED ORDER — THROMBIN 5000 UNITS EX SOLR
OROMUCOSAL | Status: DC | PRN
  Administered 2018-03-05: 5 mL via TOPICAL

## 2018-03-05 MED ORDER — DEXAMETHASONE SODIUM PHOSPHATE 10 MG/ML IJ SOLN
INTRAMUSCULAR | Status: DC | PRN
  Administered 2018-03-05: 10 mg via INTRAVENOUS

## 2018-03-05 MED ORDER — MORPHINE SULFATE (PF) 2 MG/ML IV SOLN: 1.0000 mg | INTRAVENOUS | Status: DC | PRN

## 2018-03-05 MED ORDER — FENTANYL CITRATE (PF) 250 MCG/5ML IJ SOLN
INTRAMUSCULAR | Status: DC | PRN
  Administered 2018-03-05 (×2): 50 ug via INTRAVENOUS
  Administered 2018-03-05: 25 ug via INTRAVENOUS
  Administered 2018-03-05: 50 ug via INTRAVENOUS

## 2018-03-05 MED ORDER — SODIUM CHLORIDE 0.9 % IV SOLN
INTRAVENOUS | Status: DC | PRN
  Administered 2018-03-05: 30 ug/min via INTRAVENOUS

## 2018-03-05 MED ORDER — CEFAZOLIN SODIUM-DEXTROSE 2-4 GM/100ML-% IV SOLN
2.0000 g | INTRAVENOUS | Status: AC
  Administered 2018-03-05: 2 g via INTRAVENOUS

## 2018-03-05 MED ORDER — DEXAMETHASONE 4 MG PO TABS: 4.0000 mg | ORAL_TABLET | Freq: Four times a day (QID) | ORAL | Status: DC

## 2018-03-05 MED ORDER — THROMBIN 5000 UNITS EX SOLR
CUTANEOUS | Status: AC
  Filled 2018-03-05: qty 5000

## 2018-03-05 MED ORDER — BUPIVACAINE HCL (PF) 0.5 % IJ SOLN
INTRAMUSCULAR | Status: AC
  Filled 2018-03-05: qty 30

## 2018-03-05 MED ORDER — LIDOCAINE-EPINEPHRINE 1 %-1:100000 IJ SOLN
INTRAMUSCULAR | Status: DC | PRN
  Administered 2018-03-05: 5 mL

## 2018-03-05 MED ORDER — DOCUSATE SODIUM 100 MG PO CAPS
100.0000 mg | ORAL_CAPSULE | Freq: Two times a day (BID) | ORAL | Status: DC
  Filled 2018-03-05: qty 1

## 2018-03-05 MED ORDER — ROCURONIUM BROMIDE 50 MG/5ML IV SOSY
PREFILLED_SYRINGE | INTRAVENOUS | Status: AC
  Filled 2018-03-05: qty 5

## 2018-03-05 MED ORDER — HYDROMORPHONE HCL 1 MG/ML IJ SOLN: 0.2500 mg | INTRAMUSCULAR | Status: DC | PRN

## 2018-03-05 MED ORDER — LEVETIRACETAM IN NACL 500 MG/100ML IV SOLN
500.0000 mg | Freq: Two times a day (BID) | INTRAVENOUS | Status: DC
  Administered 2018-03-05 – 2018-03-06 (×2): 500 mg via INTRAVENOUS
  Filled 2018-03-05 (×2): qty 100

## 2018-03-05 MED ORDER — PROPOFOL 10 MG/ML IV BOLUS
INTRAVENOUS | Status: DC | PRN
  Administered 2018-03-05: 20 mg via INTRAVENOUS
  Administered 2018-03-05: 100 mg via INTRAVENOUS

## 2018-03-05 MED ORDER — 0.9 % SODIUM CHLORIDE (POUR BTL) OPTIME
TOPICAL | Status: DC | PRN
  Administered 2018-03-05: 2000 mL

## 2018-03-05 MED ORDER — ONDANSETRON HCL 4 MG/2ML IJ SOLN: 4.0000 mg | INTRAMUSCULAR | Status: DC | PRN

## 2018-03-05 MED ORDER — BACITRACIN ZINC 500 UNIT/GM EX OINT
TOPICAL_OINTMENT | CUTANEOUS | Status: AC
  Filled 2018-03-05: qty 28.35

## 2018-03-05 MED ORDER — THROMBIN 20000 UNITS EX SOLR
CUTANEOUS | Status: AC
  Filled 2018-03-05: qty 20000

## 2018-03-05 MED ORDER — DEXAMETHASONE SODIUM PHOSPHATE 10 MG/ML IJ SOLN
INTRAMUSCULAR | Status: AC
  Filled 2018-03-05: qty 1

## 2018-03-05 MED ORDER — DEXAMETHASONE 4 MG PO TABS: 4.0000 mg | ORAL_TABLET | Freq: Three times a day (TID) | ORAL | Status: DC

## 2018-03-05 MED ORDER — SUCCINYLCHOLINE CHLORIDE 200 MG/10ML IV SOSY
PREFILLED_SYRINGE | INTRAVENOUS | Status: DC | PRN
  Administered 2018-03-05: 120 mg via INTRAVENOUS

## 2018-03-05 MED ORDER — ONDANSETRON HCL 4 MG PO TABS: 4.0000 mg | ORAL_TABLET | ORAL | Status: DC | PRN

## 2018-03-05 MED ORDER — CARVEDILOL 6.25 MG PO TABS
6.2500 mg | ORAL_TABLET | Freq: Two times a day (BID) | ORAL | Status: DC
  Administered 2018-03-05 – 2018-03-06 (×2): 6.25 mg via ORAL
  Filled 2018-03-05: qty 2
  Filled 2018-03-05: qty 1
  Filled 2018-03-05: qty 2
  Filled 2018-03-05: qty 1

## 2018-03-05 MED ORDER — SUGAMMADEX SODIUM 200 MG/2ML IV SOLN
INTRAVENOUS | Status: DC | PRN
  Administered 2018-03-05: 200 mg via INTRAVENOUS

## 2018-03-05 MED ORDER — BUPIVACAINE HCL (PF) 0.5 % IJ SOLN
INTRAMUSCULAR | Status: DC | PRN
  Administered 2018-03-05: 5 mL

## 2018-03-05 MED ORDER — ONDANSETRON HCL 4 MG/2ML IJ SOLN
INTRAMUSCULAR | Status: DC | PRN
  Administered 2018-03-05: 4 mg via INTRAVENOUS

## 2018-03-05 MED ORDER — ATORVASTATIN CALCIUM 40 MG PO TABS
40.0000 mg | ORAL_TABLET | Freq: Every day | ORAL | Status: DC
  Administered 2018-03-05: 40 mg via ORAL
  Filled 2018-03-05: qty 1

## 2018-03-05 MED ORDER — PHENYLEPHRINE HCL 10 MG/ML IJ SOLN
INTRAMUSCULAR | Status: DC | PRN
  Administered 2018-03-05 (×5): 80 ug via INTRAVENOUS

## 2018-03-05 MED ORDER — THROMBIN 20000 UNITS EX SOLR
CUTANEOUS | Status: DC | PRN
  Administered 2018-03-05: 20 mL via TOPICAL

## 2018-03-05 MED ORDER — LIDOCAINE-EPINEPHRINE 1 %-1:100000 IJ SOLN
INTRAMUSCULAR | Status: AC
  Filled 2018-03-05: qty 1

## 2018-03-05 MED ORDER — DEXAMETHASONE 6 MG PO TABS
6.0000 mg | ORAL_TABLET | Freq: Four times a day (QID) | ORAL | Status: AC
  Administered 2018-03-05 – 2018-03-06 (×4): 6 mg via ORAL
  Filled 2018-03-05 (×4): qty 1

## 2018-03-05 MED ORDER — POTASSIUM CHLORIDE IN NACL 20-0.9 MEQ/L-% IV SOLN
INTRAVENOUS | Status: DC
  Administered 2018-03-05 (×2): via INTRAVENOUS
  Filled 2018-03-05 (×2): qty 1000

## 2018-03-05 MED ORDER — PROMETHAZINE HCL 12.5 MG PO TABS
12.5000 mg | ORAL_TABLET | ORAL | Status: DC | PRN
  Filled 2018-03-05: qty 2

## 2018-03-05 MED ORDER — LIDOCAINE 2% (20 MG/ML) 5 ML SYRINGE
INTRAMUSCULAR | Status: DC | PRN
  Administered 2018-03-05: 60 mg via INTRAVENOUS

## 2018-03-05 MED ORDER — ACETAMINOPHEN 650 MG RE SUPP: 650.0000 mg | RECTAL | Status: DC | PRN

## 2018-03-05 MED ORDER — PANTOPRAZOLE SODIUM 40 MG PO TBEC: 80.0000 mg | DELAYED_RELEASE_TABLET | Freq: Every day | ORAL | Status: DC

## 2018-03-05 SURGICAL SUPPLY — 86 items
BIT DRILL WIRE PASS 1.3MM (BIT) IMPLANT
BLADE CLIPPER SURG (BLADE) ×3 IMPLANT
BNDG STRETCH 4X75 STRL LF (GAUZE/BANDAGES/DRESSINGS) IMPLANT
BUR ACORN 6.0 PRECISION (BURR) IMPLANT
BUR ACORN 6.0MM PRECISION (BURR)
BUR SPIRAL ROUTER 2.3 (BUR) IMPLANT
BUR SPIRAL ROUTER 2.3MM (BUR)
CANISTER SUCT 3000ML PPV (MISCELLANEOUS) ×3 IMPLANT
CARTRIDGE OIL MAESTRO DRILL (MISCELLANEOUS) ×1 IMPLANT
CLIP VESOCCLUDE MED 6/CT (CLIP) IMPLANT
CONT SPEC 4OZ CLIKSEAL STRL BL (MISCELLANEOUS) ×3 IMPLANT
COVER MAYO STAND STRL (DRAPES) IMPLANT
COVER WAND RF STERILE (DRAPES) ×3 IMPLANT
DECANTER SPIKE VIAL GLASS SM (MISCELLANEOUS) ×3 IMPLANT
DERMABOND ADVANCED (GAUZE/BANDAGES/DRESSINGS) ×2
DERMABOND ADVANCED .7 DNX12 (GAUZE/BANDAGES/DRESSINGS) ×1 IMPLANT
DIFFUSER DRILL AIR PNEUMATIC (MISCELLANEOUS) ×3 IMPLANT
DRAPE MICROSCOPE LEICA (MISCELLANEOUS) ×3 IMPLANT
DRAPE NEUROLOGICAL W/INCISE (DRAPES) ×3 IMPLANT
DRAPE STERI IOBAN 125X83 (DRAPES) IMPLANT
DRAPE WARM FLUID 44X44 (DRAPE) ×3 IMPLANT
DRILL WIRE PASS 1.3MM (BIT)
DRSG OPSITE POSTOP 4X6 (GAUZE/BANDAGES/DRESSINGS) ×3 IMPLANT
DURAMATRIX ONLAY 2X2 (Neuro Prosthesis/Implant) ×3 IMPLANT
DURAPREP 6ML APPLICATOR 50/CS (WOUND CARE) ×3 IMPLANT
ELECT REM PT RETURN 9FT ADLT (ELECTROSURGICAL) ×3
ELECTRODE REM PT RTRN 9FT ADLT (ELECTROSURGICAL) ×1 IMPLANT
EVACUATOR 1/8 PVC DRAIN (DRAIN) IMPLANT
EVACUATOR SILICONE 100CC (DRAIN) IMPLANT
GAUZE 4X4 16PLY RFD (DISPOSABLE) IMPLANT
GAUZE SPONGE 4X4 12PLY STRL (GAUZE/BANDAGES/DRESSINGS) IMPLANT
GLOVE BIO SURGEON STRL SZ 6.5 (GLOVE) ×8 IMPLANT
GLOVE BIO SURGEON STRL SZ7.5 (GLOVE) ×3 IMPLANT
GLOVE BIO SURGEON STRL SZ8 (GLOVE) ×6 IMPLANT
GLOVE BIO SURGEONS STRL SZ 6.5 (GLOVE) ×4
GLOVE BIOGEL PI IND STRL 6.5 (GLOVE) ×2 IMPLANT
GLOVE BIOGEL PI IND STRL 7.0 (GLOVE) ×2 IMPLANT
GLOVE BIOGEL PI IND STRL 7.5 (GLOVE) ×1 IMPLANT
GLOVE BIOGEL PI IND STRL 8 (GLOVE) ×1 IMPLANT
GLOVE BIOGEL PI IND STRL 8.5 (GLOVE) ×1 IMPLANT
GLOVE BIOGEL PI INDICATOR 6.5 (GLOVE) ×4
GLOVE BIOGEL PI INDICATOR 7.0 (GLOVE) ×4
GLOVE BIOGEL PI INDICATOR 7.5 (GLOVE) ×2
GLOVE BIOGEL PI INDICATOR 8 (GLOVE) ×2
GLOVE BIOGEL PI INDICATOR 8.5 (GLOVE) ×2
GLOVE ECLIPSE 8.0 STRL XLNG CF (GLOVE) ×3 IMPLANT
GLOVE EXAM NITRILE XL STR (GLOVE) IMPLANT
GOWN STRL REUS W/ TWL LRG LVL3 (GOWN DISPOSABLE) ×3 IMPLANT
GOWN STRL REUS W/ TWL XL LVL3 (GOWN DISPOSABLE) ×1 IMPLANT
GOWN STRL REUS W/TWL 2XL LVL3 (GOWN DISPOSABLE) ×3 IMPLANT
GOWN STRL REUS W/TWL LRG LVL3 (GOWN DISPOSABLE) ×6
GOWN STRL REUS W/TWL XL LVL3 (GOWN DISPOSABLE) ×2
HEMOSTAT POWDER KIT SURGIFOAM (HEMOSTASIS) ×3 IMPLANT
HEMOSTAT SURGICEL 2X14 (HEMOSTASIS) ×3 IMPLANT
KIT BASIN OR (CUSTOM PROCEDURE TRAY) ×3 IMPLANT
KIT TURNOVER KIT B (KITS) ×3 IMPLANT
MARKER SKIN DUAL TIP RULER LAB (MISCELLANEOUS) ×3 IMPLANT
MARKER SPHERE PSV REFLC 13MM (MARKER) ×6 IMPLANT
NEEDLE HYPO 25X1 1.5 SAFETY (NEEDLE) ×3 IMPLANT
NS IRRIG 1000ML POUR BTL (IV SOLUTION) ×6 IMPLANT
OIL CARTRIDGE MAESTRO DRILL (MISCELLANEOUS) ×3
PACK CRANIOTOMY CUSTOM (CUSTOM PROCEDURE TRAY) ×3 IMPLANT
PAD ARMBOARD 7.5X6 YLW CONV (MISCELLANEOUS) ×3 IMPLANT
PATTIES SURGICAL .25X.25 (GAUZE/BANDAGES/DRESSINGS) IMPLANT
PATTIES SURGICAL .5 X.5 (GAUZE/BANDAGES/DRESSINGS) IMPLANT
PATTIES SURGICAL .5 X3 (DISPOSABLE) IMPLANT
PATTIES SURGICAL 1/4 X 3 (GAUZE/BANDAGES/DRESSINGS) IMPLANT
PATTIES SURGICAL 1X1 (DISPOSABLE) IMPLANT
PIN MAYFIELD SKULL DISP (PIN) ×3 IMPLANT
RUBBERBAND STERILE (MISCELLANEOUS) ×6 IMPLANT
SPECIMEN JAR SMALL (MISCELLANEOUS) IMPLANT
SPONGE NEURO XRAY DETECT 1X3 (DISPOSABLE) IMPLANT
SPONGE SURGIFOAM ABS GEL 100 (HEMOSTASIS) ×3 IMPLANT
STAPLER SKIN PROX WIDE 3.9 (STAPLE) ×3 IMPLANT
SUT ETHILON 3 0 FSL (SUTURE) ×3 IMPLANT
SUT NURALON 4 0 TR CR/8 (SUTURE) ×6 IMPLANT
SUT SILK 2 0 PERMA HAND 18 BK (SUTURE) IMPLANT
SUT VIC AB 2-0 CP2 18 (SUTURE) ×6 IMPLANT
SYR CONTROL 10ML LL (SYRINGE) ×3 IMPLANT
TOWEL GREEN STERILE (TOWEL DISPOSABLE) ×3 IMPLANT
TOWEL GREEN STERILE FF (TOWEL DISPOSABLE) ×3 IMPLANT
TRAY FOLEY MTR SLVR 16FR STAT (SET/KITS/TRAYS/PACK) ×3 IMPLANT
TUBE CONNECTING 12'X1/4 (SUCTIONS) ×1
TUBE CONNECTING 12X1/4 (SUCTIONS) ×2 IMPLANT
UNDERPAD 30X30 (UNDERPADS AND DIAPERS) IMPLANT
WATER STERILE IRR 1000ML POUR (IV SOLUTION) ×3 IMPLANT

## 2018-03-05 NOTE — Progress Notes (Signed)
SBP in the 90's by cuff, 104 by art line, pt awake, alert, no c/o. Dr Kalman Shan updated-no new orders-OK to tx to 4N.

## 2018-03-05 NOTE — Progress Notes (Signed)
Patient ID: Alexander Monroe, male   DOB: 1950-06-18, 67 y.o.   MRN: [id number] Alert,conversant,reporting minimal incisional pain.Declinespain med,noting position changes relieve discomfort. PEARL. No drift. MAEW with good strength.  Incision without erythema swelling or drainage beneath honeycomb.

## 2018-03-05 NOTE — Progress Notes (Signed)
On review of patient's postop imaging, there is significant remaining tumor after suboccipital craniectomy and tumor resection.  I presented images to [facility] and later reviewed case with patient's oncologist, Dr. Bobby Rumpf.  Dr. Bobby Rumpf feels that systemic disease is currently well-controlled and that brain disease is most significant issue for patient.  In light of 14 Gy treatment, we recommend repeat resection as best means of long-term local control.  I then discussed this with patient and he is in agreement with this plan and has consented for repeat sub-occipital craniectomy for remaining tumor.

## 2018-03-05 NOTE — Brief Op Note (Signed)
03/05/2018  9:47 AM  PATIENT:  Alexander Monroe  67 y.o. 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  PROCEDURE:  Procedure(s): Redo Suboccipital craniectomy for tumor with brainlab (N/A) APPLICATION OF CRANIAL NAVIGATION (N/A)  SURGEON:  Surgeon(s) and Role:    Erline Levine, MD - Primary    * Ostergard, Joyice Faster, MD - Assisting  PHYSICIAN ASSISTANT:   ASSISTANTS: Poteat, RN   ANESTHESIA:   general  EBL:  75 mL   BLOOD ADMINISTERED:none  DRAINS: none   LOCAL MEDICATIONS USED:  MARCAINE    and LIDOCAINE   SPECIMEN:  Excision  DISPOSITION OF SPECIMEN:  PATHOLOGY  COUNTS:  YES  TOURNIQUET:  * No tourniquets in log *  DICTATION: Patient is 67 year old man with a large cerebellar metastatic brain tumor, non small cell lung cancer primary.  Patient has undergone subtotal resection two weeks ago and comes back to OR to complete resection.  He had undergone preoperative stereotactic radiosurgery to solitary brain metastasis.  It was elected to take patient to surgery for redo sub-occipital craniectomy for metastasis.  Procedure:  Following smooth intubation, patient was placed in prone position on chest rolls with 3 pin head fixation. Occipital region was shaved and prepped and draped in usual sterile fashion with betadine scrub and Duraprep after removing previously placed sutures.  Area of planned incision was infiltrated with lidocaine. A linear incision was reopened in the suboccipital region to expose previous craniectomy. The dura was reopened and the resection cavity was exposed.  Using BrainLab navigation and microscope, the retained tumor on the right side of the resection cavity was identified and removed.    This was dense and whitish in color and we ere able to dissect this from surrounding cerebellum.   The entire tumor was removed.    Hemostasis was assured with irrigation and  Surgifoam.  The brain was considerably more relaxed after tumor resection.  The evacuation cavity was lined with Surgifoam. The dura was patched with Dura-matrix and  the fascia and subcutaneous tissues were closed with 2-0 Vicryl sutures and the skin was re approximated with  3-0 Nylon stitch.  A sterile occlusive dressing was placed with Dermabond and Opsite Visible.  Patient was returned to a supine position and taken out of pins and extubated in the operating room and taken to Recovery having tolerated his surgery well.  Counts were correct at the end of the case.   PLAN OF CARE: Admit to inpatient   PATIENT DISPOSITION:  PACU - hemodynamically stable.   Delay start of Pharmacological VTE agent (>24hrs) due to surgical blood loss or risk of bleeding: yes

## 2018-03-05 NOTE — Anesthesia Procedure Notes (Signed)
Procedure Name: Intubation Date/Time: 03/05/2018 7:41 AM Performed by: Wilburn Cornelia, CRNA Pre-anesthesia Checklist: Patient identified, Emergency Drugs available, Suction available, Patient being monitored and Timeout performed Patient Re-evaluated:Patient Re-evaluated prior to induction Oxygen Delivery Method: Circle system utilized Preoxygenation: Pre-oxygenation with 100% oxygen Induction Type: IV induction Ventilation: Oral airway inserted - appropriate to patient size and Two handed mask ventilation required Laryngoscope Size: Glidescope and 4 Grade View: Grade I Tube type: Oral Tube size: 7.5 mm Number of attempts: 1 Airway Equipment and Method: Stylet Placement Confirmation: ETT inserted through vocal cords under direct vision,  positive ETCO2 and breath sounds checked- equal and bilateral Secured at: 22 (at gums) cm Tube secured with: Tape Dental Injury: Teeth and Oropharynx as per pre-operative assessment  Comments: Intubation by Eugene Garnet, SRNA.

## 2018-03-05 NOTE — Interval H&P Note (Signed)
History and Physical Interval Note:  03/05/2018 7:34 AM  Alexander Monroe  has presented today for surgery, with the diagnosis of brain tumor  The various methods of treatment have been discussed with the patient and family. After consideration of risks, benefits and other options for treatment, the patient has consented to  Procedure(s) with comments: Redo Suboccipital craniotomy for tumor with brainlab (N/A) - Redo Suboccipital craniotomy for tumor with brainlab APPLICATION OF CRANIAL NAVIGATION (N/A) as a surgical intervention .  The patient's history has been reviewed, patient examined, no change in status, stable for surgery.  I have reviewed the patient's chart and labs.  Questions were answered to the patient's satisfaction.     Peggyann Shoals

## 2018-03-05 NOTE — Anesthesia Postprocedure Evaluation (Signed)
Anesthesia Post Note  Patient: Alexander Monroe  Procedure(s) Performed: Redo Suboccipital craniectomy for tumor with brainlab (N/A Head) APPLICATION OF CRANIAL NAVIGATION (N/A Head)     Patient location during evaluation: PACU Anesthesia Type: General Level of consciousness: awake and alert Pain management: pain level controlled Vital Signs Assessment: post-procedure vital signs reviewed and stable Respiratory status: spontaneous breathing, nonlabored ventilation, respiratory function stable and patient connected to nasal cannula oxygen Cardiovascular status: blood pressure returned to baseline and stable Postop Assessment: no apparent nausea or vomiting Anesthetic complications: no    Last Vitals:  Vitals:   03/05/18 1015 03/05/18 1019  BP: 94/76 94/76  Pulse: 71 75  Resp: 15 20  Temp:    SpO2: 100% 100%    Last Pain:  Vitals:   03/05/18 1019  TempSrc:   PainSc: 0-No pain                 Crysten Kaman S

## 2018-03-05 NOTE — Progress Notes (Signed)
Awake, alert, conversant.  MAEW.  Minimal discomfort.  Doing well.

## 2018-03-05 NOTE — Progress Notes (Signed)
Brain and Spine Tumor Board Documentation  Alexander Monroe was presented by Cecil Cobbs, MD at Brain and Spine Tumor Board on 03/05/2018, which included representatives from neuro oncology, radiation oncology, surgical oncology, radiology, pathology, navigation.  Alexander Monroe was presented as a current patient with history of the following treatments: surgical intervention(s).  Additionally, we reviewed previous medical and familial history, history of present illness, and recent lab results along with all available histopathologic and imaging studies. The tumor board considered available treatment options and made the following recommendations:  Additional screening, Surgery To consider re-resection of residual tumor.  Will discuss with patient.  Tumor board is a meeting of clinicians from various specialty areas who evaluate and discuss patients for whom a multidisciplinary approach is being considered. Final determinations in the plan of care are those of the provider(s). The responsibility for follow up of recommendations given during tumor board is that of the provider.   Today's extended care, comprehensive team conference, Alexander Monroe was not present for the discussion and was not examined.

## 2018-03-05 NOTE — Transfer of Care (Signed)
Immediate Anesthesia Transfer of Care Note  Patient: Alexander Monroe  Procedure(s) Performed: Redo Suboccipital craniectomy for tumor with brainlab (N/A Head) APPLICATION OF CRANIAL NAVIGATION (N/A Head)  Patient Location: PACU  Anesthesia Type:General  Level of Consciousness: awake, alert  and oriented  Airway & Oxygen Therapy: Patient Spontanous Breathing and Patient connected to nasal cannula oxygen  Post-op Assessment: Reviewed and stable.  Post vital signs: Reviewed and stable  Last Vitals:  Vitals Value Taken Time  BP 94/76 03/05/2018  9:59 AM  Temp    Pulse 76 03/05/2018 10:01 AM  Resp 15 03/05/2018 10:01 AM  SpO2 99 % 03/05/2018 10:01 AM  Vitals shown include unvalidated device data.  Last Pain:  Vitals:   03/05/18 0605  TempSrc:   PainSc: 0-No pain         Complications: No apparent anesthesia complications

## 2018-03-05 NOTE — Op Note (Signed)
03/05/2018  9:47 AM  PATIENT:  Alexander Monroe  67 y.o. 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  PROCEDURE:  Procedure(s): Redo Suboccipital craniectomy for tumor with brainlab (N/A) APPLICATION OF CRANIAL NAVIGATION (N/A)  SURGEON:  Surgeon(s) and Role:    Erline Levine, MD - Primary    * Ostergard, Joyice Faster, MD - Assisting  PHYSICIAN ASSISTANT:   ASSISTANTS: Poteat, RN   ANESTHESIA:   general  EBL:  75 mL   BLOOD ADMINISTERED:none  DRAINS: none   LOCAL MEDICATIONS USED:  MARCAINE    and LIDOCAINE   SPECIMEN:  Excision  DISPOSITION OF SPECIMEN:  PATHOLOGY  COUNTS:  YES  TOURNIQUET:  * No tourniquets in log *  DICTATION: Patient is 67 year old man with a large cerebellar metastatic brain tumor, non small cell lung cancer primary.  Patient has undergone subtotal resection two weeks ago and comes back to OR to complete resection.  He had undergone preoperative stereotactic radiosurgery to solitary brain metastasis.  It was elected to take patient to surgery for redo sub-occipital craniectomy for metastasis.  Procedure:  Following smooth intubation, patient was placed in prone position on chest rolls with 3 pin head fixation. Occipital region was shaved and prepped and draped in usual sterile fashion with betadine scrub and Duraprep after removing previously placed sutures.  Area of planned incision was infiltrated with lidocaine. A linear incision was reopened in the suboccipital region to expose previous craniectomy. The dura was reopened and the resection cavity was exposed.  Using BrainLab navigation and microscope, the retained tumor on the right side of the resection cavity was identified and removed.    This was dense and whitish in color and we ere able to dissect this from surrounding cerebellum.   The entire tumor was removed.    Hemostasis was assured with irrigation and  Surgifoam.  The brain was considerably more relaxed after tumor resection.  The evacuation cavity was lined with Surgifoam. The dura was patched with Dura-matrix and  the fascia and subcutaneous tissues were closed with 2-0 Vicryl sutures and the skin was re approximated with  3-0 Nylon stitch.  A sterile occlusive dressing was placed with Dermabond and Opsite Visible.  Patient was returned to a supine position and taken out of pins and extubated in the operating room and taken to Recovery having tolerated his surgery well.  Counts were correct at the end of the case.   PLAN OF CARE: Admit to inpatient   PATIENT DISPOSITION:  PACU - hemodynamically stable.   Delay start of Pharmacological VTE agent (>24hrs) due to surgical blood loss or risk of bleeding: yes

## 2018-03-05 NOTE — Anesthesia Preprocedure Evaluation (Signed)
Anesthesia Evaluation  Patient identified by MRN, date of birth, ID band Patient awake    Reviewed: Allergy & Precautions, NPO status , Patient's Chart, lab work & pertinent test results  Airway Mallampati: II  TM Distance: >3 FB Neck ROM: Full    Dental no notable dental hx.    Pulmonary Current Smoker,    Pulmonary exam normal breath sounds clear to auscultation       Cardiovascular hypertension, Normal cardiovascular exam Rhythm:Regular Rate:Normal     Neuro/Psych negative neurological ROS  negative psych ROS   GI/Hepatic Neg liver ROS, GERD  ,  Endo/Other  negative endocrine ROS  Renal/GU negative Renal ROS  negative genitourinary   Musculoskeletal negative musculoskeletal ROS (+)   Abdominal   Peds negative pediatric ROS (+)  Hematology negative hematology ROS (+)   Anesthesia Other Findings   Reproductive/Obstetrics negative OB ROS                             Anesthesia Physical Anesthesia Plan  ASA: III  Anesthesia Plan: General   Post-op Pain Management:    Induction: Intravenous  PONV Risk Score and Plan: 2 and Ondansetron, Dexamethasone and Treatment may vary due to age or medical condition  Airway Management Planned: Oral ETT  Additional Equipment: Arterial line  Intra-op Plan:   Post-operative Plan: Possible Post-op intubation/ventilation  Informed Consent: I have reviewed the patients History and Physical, chart, labs and discussed the procedure including the risks, benefits and alternatives for the proposed anesthesia with the patient or authorized representative who has indicated his/her understanding and acceptance.   Dental advisory given  Plan Discussed with: CRNA and Surgeon  Anesthesia Plan Comments:         Anesthesia Quick Evaluation

## 2018-03-05 NOTE — Anesthesia Procedure Notes (Signed)
Arterial Line Insertion Start/End12/19/2019 6:45 AM, 03/05/2018 7:10 AM Performed by: Wilburn Cornelia, CRNA, CRNA  Patient location: Pre-op. Preanesthetic checklist: patient identified, IV checked, site marked, risks and benefits discussed, surgical consent, monitors and equipment checked, pre-op evaluation, timeout performed and anesthesia consent Lidocaine 1% used for infiltration Left, radial was placed Catheter size: 20 G Hand hygiene performed , maximum sterile barriers used  and Seldinger technique used  Attempts: 4 Procedure performed without using ultrasound guided technique. Following insertion, dressing applied and Biopatch. Post procedure assessment: normal  Patient tolerated the procedure well with no immediate complications.

## 2018-03-06 ENCOUNTER — Encounter (HOSPITAL_COMMUNITY): Payer: Self-pay | Admitting: Neurosurgery

## 2018-03-06 ENCOUNTER — Inpatient Hospital Stay (HOSPITAL_COMMUNITY): Payer: Medicare Other

## 2018-03-06 MED ORDER — GADOBUTROL 1 MMOL/ML IV SOLN
7.0000 mL | Freq: Once | INTRAVENOUS | Status: AC | PRN
  Administered 2018-03-06: 7 mL via INTRAVENOUS

## 2018-03-06 NOTE — Plan of Care (Signed)
Pt doing well with no pain, foley out, ambulated in hallway, and ready for discharge.

## 2018-03-06 NOTE — Discharge Summary (Signed)
Physician Discharge Summary  Patient ID: Alexander Monroe MRN: [id number] DOB/AGE: [birth year] 67 y.o.  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 Following uncomplicated surgery, he recovered nicely and transferred to Neuro ICU. He is mobilizing well.  Postoperative MRI scan shows gross total resection of neoplasm with no areas of enhancement.    Consults: None  Significant Diagnostic Studies:   Treatments: surgery: Redo Suboccipital craniectomy for tumor with brainlab (N/A) APPLICATION OF CRANIAL NAVIGATION (N/A)    Discharge Exam: Blood pressure (!) 133/94, pulse 89, temperature 97.9 F (36.6 C), temperature source Oral, resp. rate 17, height 5\' 11"  (1.803 m), weight 71.2 kg, SpO2 95 %. Alert,conversant.MAEW. PEARL. No drift. Incision flat withoute erythema, swelling or drainage beneath honeycomb drsg.    Disposition: Discharge to home. Office f/u in 2 weeks for suture removal. Ok to remove honeycomb drsg in 2-3 days. Ok to shower, wash hair at that time. Pt has pain med at home for prn use.     Discharge Instructions    Diet - low sodium heart healthy   Complete by:  As directed    Increase activity slowly   Complete by:  As directed      Allergies as of 03/06/2018   No Known Allergies     Medication List    TAKE these medications   aspirin EC 81 MG tablet Take 81 mg by mouth daily.   atorvastatin 40 MG tablet Commonly known as:  LIPITOR Take 40 mg by mouth at  bedtime.   carvedilol 12.5 MG tablet Commonly known as:  COREG Take 6.25 mg by mouth 2 (two) times daily with a meal.   dexamethasone 4 MG tablet Commonly known as:  DECADRON Take 4 mg by mouth 3 (three) times daily.   HYDROcodone-acetaminophen 5-325 MG tablet Commonly known as:  NORCO/VICODIN Take 1 tablet by mouth every 4 (four) hours as needed for moderate pain.   omeprazole 20 MG capsule Commonly known as:  PRILOSEC Take 20 mg by mouth daily.   ondansetron 4 MG tablet Commonly known as:  ZOFRAN Take 4 mg by mouth every 4 (four) hours as needed for nausea or vomiting.        Signed: Peggyann Shoals, MD 03/06/2018, 10:07 AM

## 2018-03-06 NOTE — Discharge Instructions (Signed)
Follow up with Dr Vertell Limber in 2 weeks for suture removal. Ok to remove honeycomb dressing in 2-3 days. Ok to shower, wash hair at that time. Use pain meds at home for PRN use.

## 2018-03-06 NOTE — Progress Notes (Addendum)
Subjective: Patient reports "I feel fine"  Objective: Vital signs in last 24 hours: Temp:  [97 F (36.1 C)-99.7 F (37.6 C)] 98.2 F (36.8 C) (12/20 0000) Pulse Rate:  [59-103] 101 (12/20 0700) Resp:  [9-31] 19 (12/20 0700) BP: (92-122)/(65-101) 120/78 (12/20 0700) SpO2:  [95 %-100 %] 100 % (12/20 0700) Arterial Line BP: (86-149)/(54-87) 138/73 (12/20 0700) Weight:  [71.2 kg] 71.2 kg (12/19 1053)  Intake/Output from previous day: 12/19 0701 - 12/20 0700 In: 3030.3 [I.V.:2630.7; IV Piggyback:299.7] Out: 5462 [Urine:3265; Blood:75] Intake/Output this shift: No intake/output data recorded.  Alert,conversant.MAEW. PEARL. No drift. Incision flat withoute erythema, swelling or drainage beneath honeycomb drsg.   Lab Results: Recent Labs    03/05/18 0841  HGB 10.9*  HCT 32.0*   BMET Recent Labs    03/05/18 0841  NA 137  K 4.0    Studies/Results: Mr Jeri Cos VO Contrast  Result Date: 03/06/2018 CLINICAL DATA:  Non-small cell lung cancer. Intracranial metastatic disease. Status post initial subtotal resection, stereotactic radio surgery and secondary resection. EXAM: MRI HEAD WITHOUT AND WITH CONTRAST TECHNIQUE: Multiplanar, multiecho pulse sequences of the brain and surrounding structures were obtained without and with intravenous contrast. CONTRAST:  7 mL Gadavist COMPARISON:  Brain MRI 02/25/2018 FINDINGS: BRAIN: Status post secondary resection of posterior fossa tumor. There is magnetic susceptibility effect at the resection site and at the frontal poles, consistent with pneumocephalus. There is also a small amount of blood in the resection cavity. There are multiple small foci of magnetic susceptibility effect over both superior convexities. Edema in the posterior fossa has decreased. There is hyperintense T1-weighted signal material within the resection cavity, likely blood. There is no residual contrast enhancement. There are no new contrast-enhancing lesions. VASCULAR: Major  intracranial arterial and venous sinus flow voids are normal. SKULL AND UPPER CERVICAL SPINE: Status post suboccipital craniectomy. SINUSES/ORBITS: No fluid levels or advanced mucosal thickening. No mastoid or middle ear effusion. The orbits are normal. IMPRESSION: 1. Status post secondary resection of posterior fossa isolated metastasis with no residual contrast enhancing tumor. This will be a baseline for future studies. 2. Decreased posterior fossa edema. 3. Small volume postoperative blood products and pneumocephalus. 4. No new contrast-enhancing lesion. Electronically Signed   By: Ulyses Jarred M.D.   On: 03/06/2018 04:25    Assessment/Plan: Doing well post-op day 1   LOS: 1 day  MRI revealed complete tumor resection. Ok per Dr. Vertell Limber to mobilize this am. Plan for d/c to home after mobilizing.     Alexander Monroe 03/06/2018, 8:01 AM   MRI shows gross total resection of tumor with no persistent enhancement.

## 2018-03-14 DIAGNOSIS — T148XXA Other injury of unspecified body region, initial encounter: Secondary | ICD-10-CM

## 2018-03-14 HISTORY — DX: Other injury of unspecified body region, initial encounter: T14.8XXA

## 2018-03-16 ENCOUNTER — Other Ambulatory Visit: Payer: Self-pay | Admitting: Radiation Therapy

## 2018-03-16 ENCOUNTER — Encounter: Payer: Self-pay | Admitting: Radiation Oncology

## 2018-03-16 NOTE — Progress Notes (Signed)
error 

## 2018-03-24 DIAGNOSIS — C7931 Secondary malignant neoplasm of brain: Secondary | ICD-10-CM | POA: Diagnosis not present

## 2018-03-24 DIAGNOSIS — Z0001 Encounter for general adult medical examination with abnormal findings: Secondary | ICD-10-CM

## 2018-03-24 DIAGNOSIS — B029 Zoster without complications: Secondary | ICD-10-CM

## 2018-03-24 DIAGNOSIS — C3431 Malignant neoplasm of lower lobe, right bronchus or lung: Secondary | ICD-10-CM

## 2018-03-27 ENCOUNTER — Telehealth: Payer: Self-pay

## 2018-03-27 ENCOUNTER — Ambulatory Visit
Admission: RE | Admit: 2018-03-27 | Discharge: 2018-03-27 | Disposition: A | Discharge status: Home / Self Care | Payer: Medicare Other | Source: Ambulatory Visit | Attending: Radiation Oncology | Admitting: Radiation Oncology

## 2018-03-27 HISTORY — DX: Personal history of irradiation: Z92.3

## 2018-03-27 NOTE — Telephone Encounter (Signed)
I called Alexander Monroe related to his missed appointment scheduled for today at 2:30. He was at home and reports not feeling well due to a cold. I offered to reschedule his appointment. He declined at this time and told me that he will call our office when he is feeling better.

## 2018-04-14 ENCOUNTER — Other Ambulatory Visit: Payer: Self-pay | Admitting: Radiation Therapy

## 2018-04-14 DIAGNOSIS — C7949 Secondary malignant neoplasm of other parts of nervous system: Principal | ICD-10-CM

## 2018-04-14 DIAGNOSIS — C7931 Secondary malignant neoplasm of brain: Secondary | ICD-10-CM

## 2018-04-15 DIAGNOSIS — C3431 Malignant neoplasm of lower lobe, right bronchus or lung: Secondary | ICD-10-CM

## 2018-04-15 DIAGNOSIS — C7931 Secondary malignant neoplasm of brain: Secondary | ICD-10-CM

## 2018-05-08 ENCOUNTER — Telehealth: Payer: Self-pay | Admitting: Radiation Therapy

## 2018-05-08 NOTE — Telephone Encounter (Signed)
Called to check in on Alexander Monroe, his partner confirmed that he is still with Hospice and has not been doing very well. I have shared this with Dr. Isidore Moos as well.   Mont Dutton R.T.(R)(T) Special Procedures Navigator

## 2018-05-17 DEATH — deceased

## 2018-06-08 ENCOUNTER — Other Ambulatory Visit: Payer: Medicare Other

## 2018-06-10 ENCOUNTER — Ambulatory Visit: Payer: Medicare Other | Admitting: Radiation Oncology

## 2018-06-10 ENCOUNTER — Ambulatory Visit: Payer: Self-pay | Admitting: Radiation Oncology

## 2020-01-17 IMAGING — MR MR HEAD WO/W CM
17 of 18 series · 29 of 48 positions shown · IV contrast (Y MULTI)
Comparison: 02/10/2018 and 05/28/2017 MRI of the head.

CLINICAL DATA: 67 y/o M; preop planning, secondary malignant
neoplasm of the brain. SRS protocol. History of lung cancer.

EXAM:
MRI HEAD WITHOUT AND WITH CONTRAST
TECHNIQUE: Multiplanar, multiecho pulse sequences of the brain and surrounding
structures were obtained without and with intravenous contrast.
CONTRAST:  7 cc Gadavist.

[Series 2: FLAIR · sagittal · 3.0mm · 0.47mm/px · 1 of 43 slices shown (1 of 3)]
[im 1/43]
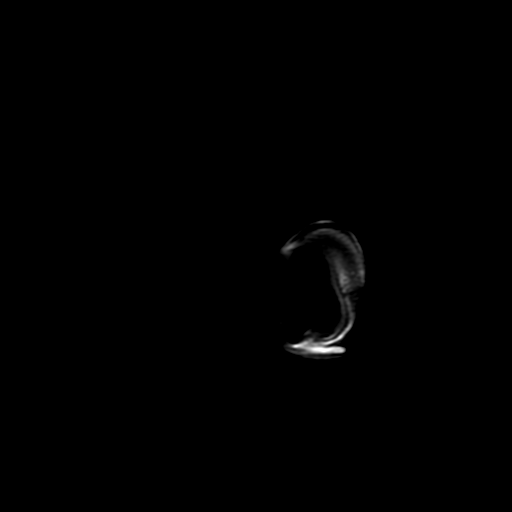

[Series 3: T2 · axial · 5.0mm · 0.43mm/px · 1 of 30 slices shown]
[im 1/30]
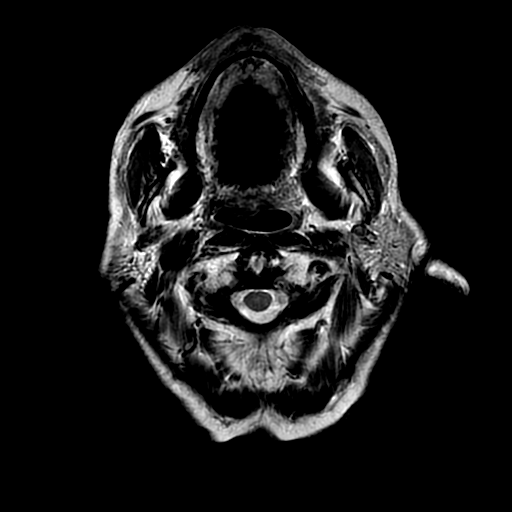

[Series 5: ax dti · axial · 3.0mm · 0.94mm/px · z∈[+16,+178]mm · 8 of 1560 slices shown]
[im 78/1560]
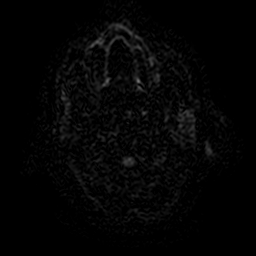
[im 234/1560]
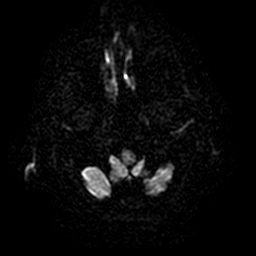
[im 468/1560]
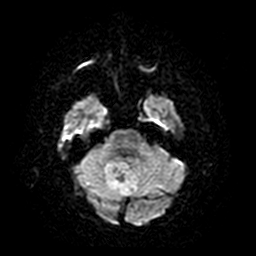
[im 702/1560]
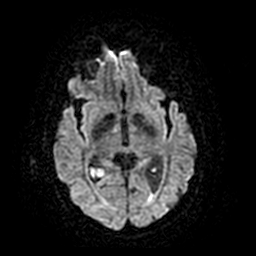
[im 858/1560]
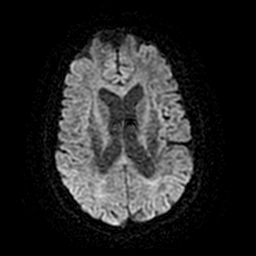
[im 1092/1560]
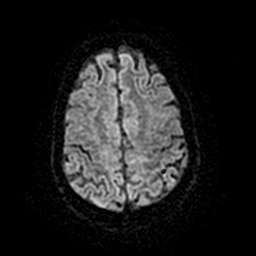
[im 1326/1560]
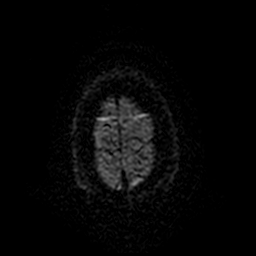
[im 1482/1560]
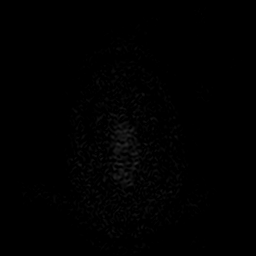

[Series 7: (person_name) · axial · 3.0mm · 0.47mm/px · 1 of 120 slices shown]
[im 1/120]
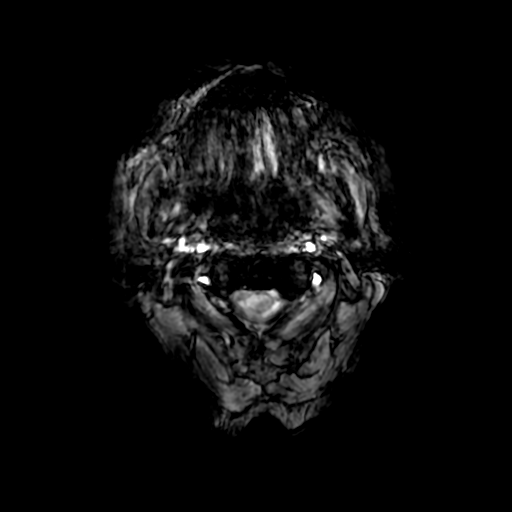

[Series 8: FLAIR · axial · 3.0mm · 0.45mm/px · 1 of 60 slices shown (2 of 3)]
[im 1/60]
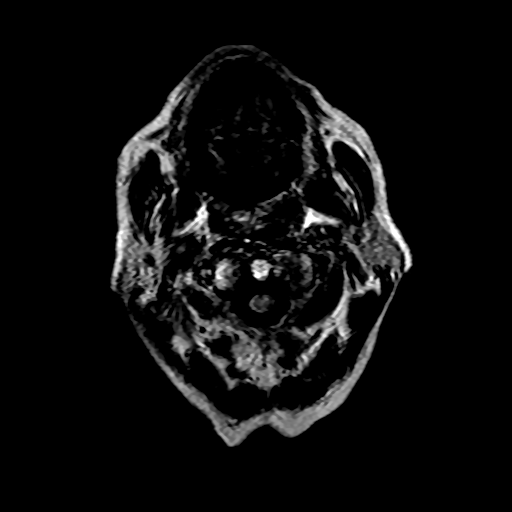

[Series 12: ax 3(person_name) · axial · 1.0mm · 0.94mm/px · 1 of 180 slices shown]
[im 1/180]
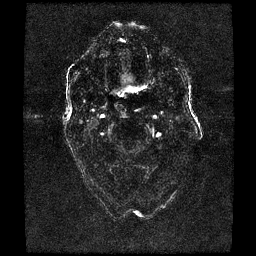

[Series 13: T2 post-contrast · coronal · 3.0mm · 0.39mm/px · 1 of 55 slices shown]
[im 1/55]
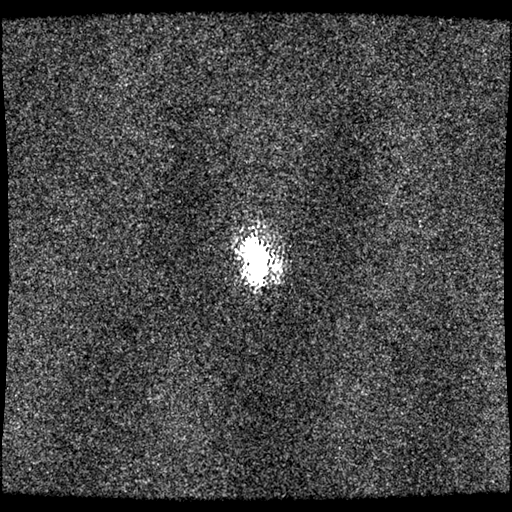

[Series 15: ax 3(person_name) +c · axial · 1.0mm · 0.94mm/px · z∈[+9,+188]mm · 2 of 180 slices shown]
[im 1/180]
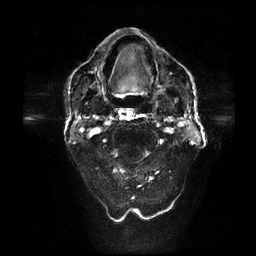
[im 180/180]
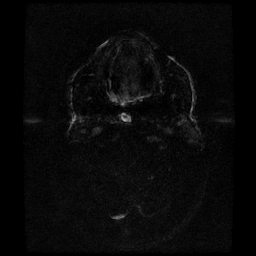

[Series 16: T1 · coronal · 3.0mm · 0.39mm/px · 1 of 55 slices shown]
[im 1/55]
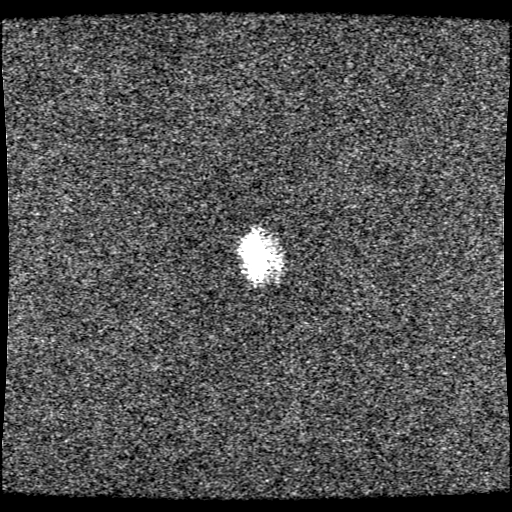

[Series 17: FLAIR · sagittal · 3.0mm · 0.47mm/px · 1 of 43 slices shown (3 of 3)]
[im 1/43]
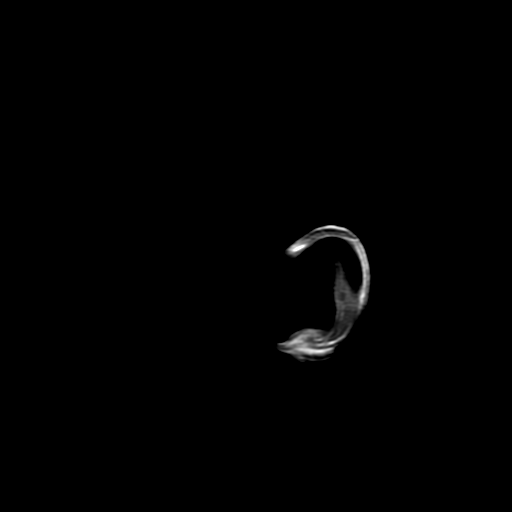

[Series 550: trace:(date) (date) est · axial · 3.0mm · 0.94mm/px · 1 of 60 slices shown]
[im 1/60]
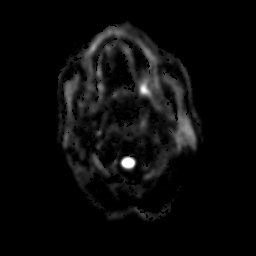

[Series 551: fa:(date) (date) est · axial · 3.0mm · 0.94mm/px · 1 of 57 slices shown]
[im 1/57]
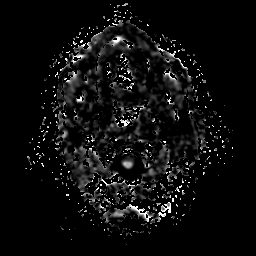

[Series 552: avdc:(date) (date) est · axial · 3.0mm · 0.94mm/px · 1 of 60 slices shown]
[im 1/60]
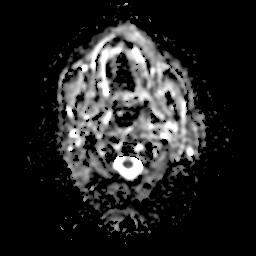

[Series 553: universal:551:fa:(date) (date) est · axial · 3.0mm · 0.94mm/px · 1 of 57 slices shown]
[im 1/57]
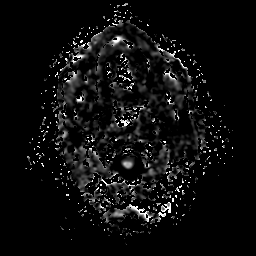

[Series 554: universal:552:avdc:(date) (date) est · axial · 3.0mm · 0.94mm/px · 1 of 60 slices shown]
[im 1/60]
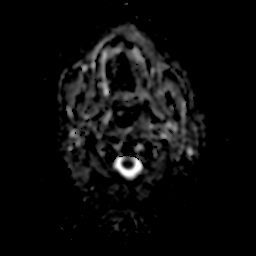

[Series 600: multiplanar reconstruction (mpr) · axial · 1.0mm · 0.50mm/px · z∈[-45,+211]mm · 4 of 257 slices shown (1 of 2)]
[im 1/257]
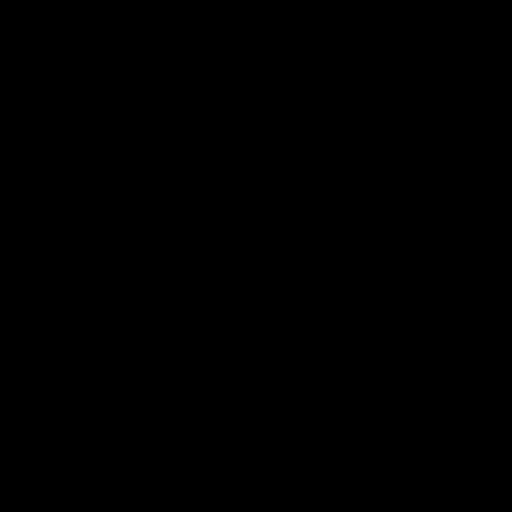
[im 86/257]
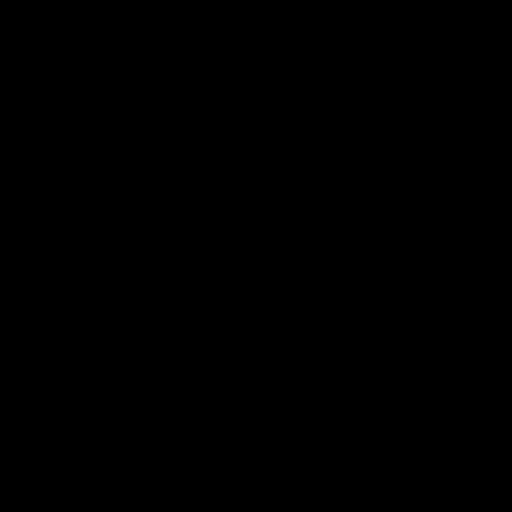
[im 171/257]
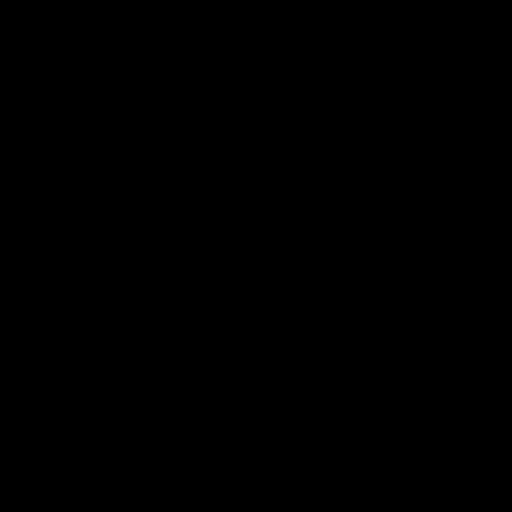
[im 257/257]
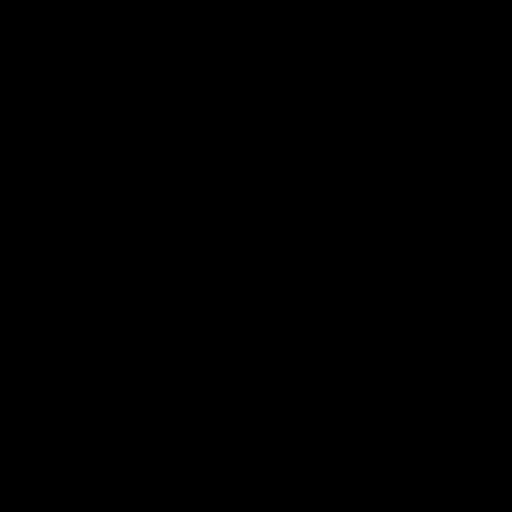

[Series 601: multiplanar reconstruction (mpr) · coronal · 1.0mm · 0.50mm/px · 2 of 257 slices shown (2 of 2)]
[im 1/257]
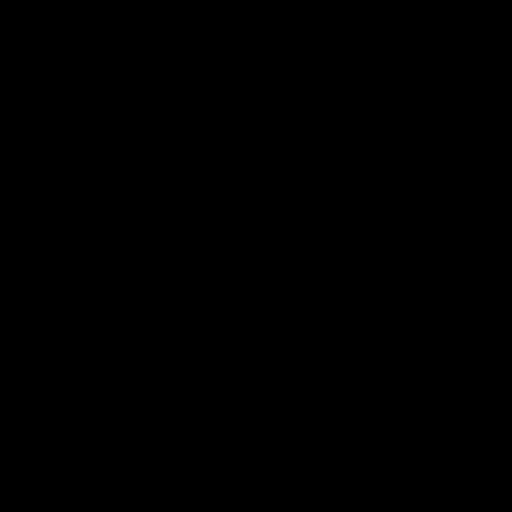
[im 86/257]
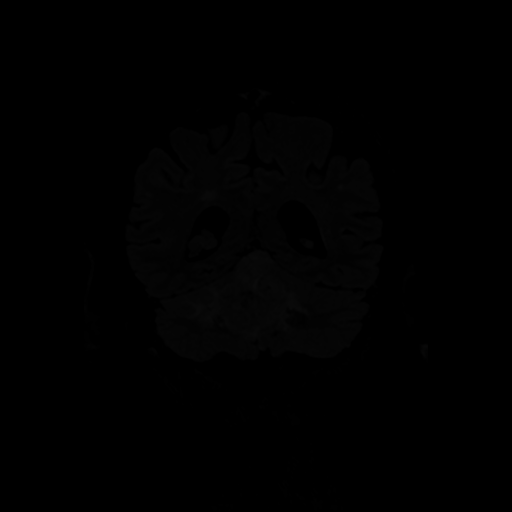

[29 of 48 positions shown; findings below may reference images not displayed]

FINDINGS: Brain: Metastasis within the right medial cerebellar hemisphere and
vermis is increased in size measuring 3.8 x 2.9 x 3.2 cm (AP x ML x
CC series 17, image 21 and series 5, image 50). Mass effect from the
lesion displaces the superior and middle cerebellar peduncles
anteriorly and laterally (fractional anisotropy series 553, image
16). The enhancing regions of the mass demonstrate reduced
diffusion. Interval development of several stable foci of
susceptibility hypointensity compatible with petechial hemorrhage.
Surrounding vasogenic edema extending throughout the cerebellum is
stable in comparison with the prior MRI of the brain. Additionally,
mass effect partially effacing the fourth ventricle is stable.

Stable nonenhancing T2 hyperintense focus within the left medial
cerebellar hemisphere from [DATE], probably a chronic lacunar
infarct.

No additional focus of abnormal enhancement of the brain parenchyma.
Stable lateral and third ventricle size. Stable nonspecific white
matter hyperintensities of the supratentorial brain compatible with
mild chronic microvascular ischemic changes. Stable mild volume loss
of the brain. No extra-axial collection or herniation.

Vascular: Normal flow voids.

Skull and upper cervical spine: Normal marrow signal.

Sinuses/Orbits: Negative.

Other: None.
IMPRESSION: 1. Right medial cerebellar hemisphere/vermis metastasis is mildly
increased in size, probably at least in part related to interval
petechial hemorrhage. Stable associated mass effect and surrounding
vasogenic edema. Superior and middle cerebellar peduncle fiber
tracks are displaced along the anterior and lateral margin of the
mass.
2. No new metastasis identified.
